# Patient Record
Sex: Female | Born: 1990 | Race: Black or African American | Hispanic: No | Marital: Single | Smoking: Current every day smoker
Health system: Southern US, Community
[De-identification: ages and names within clinical notes are randomized; demographics above are authoritative.]

## PROBLEM LIST (undated history)

## (undated) ENCOUNTER — Inpatient Hospital Stay (HOSPITAL_COMMUNITY): Payer: Self-pay

## (undated) DIAGNOSIS — K5900 Constipation, unspecified: Secondary | ICD-10-CM

## (undated) DIAGNOSIS — J45909 Unspecified asthma, uncomplicated: Secondary | ICD-10-CM

## (undated) DIAGNOSIS — J3089 Other allergic rhinitis: Secondary | ICD-10-CM

## (undated) DIAGNOSIS — K219 Gastro-esophageal reflux disease without esophagitis: Secondary | ICD-10-CM

## (undated) HISTORY — DX: Constipation, unspecified: K59.00

## (undated) HISTORY — PX: WISDOM TOOTH EXTRACTION: SHX21

---

## 2012-11-06 ENCOUNTER — Encounter (HOSPITAL_COMMUNITY): Payer: Self-pay | Admitting: Emergency Medicine

## 2012-11-06 ENCOUNTER — Emergency Department (HOSPITAL_COMMUNITY)
Admission: EM | Admit: 2012-11-06 | Discharge: 2012-11-06 | Disposition: A | Discharge status: Home / Self Care | Payer: Self-pay | Attending: Emergency Medicine | Admitting: Emergency Medicine

## 2012-11-06 DIAGNOSIS — Y9339 Activity, other involving climbing, rappelling and jumping off: Secondary | ICD-10-CM | POA: Insufficient documentation

## 2012-11-06 DIAGNOSIS — X500XXA Overexertion from strenuous movement or load, initial encounter: Secondary | ICD-10-CM | POA: Insufficient documentation

## 2012-11-06 DIAGNOSIS — F17200 Nicotine dependence, unspecified, uncomplicated: Secondary | ICD-10-CM | POA: Insufficient documentation

## 2012-11-06 DIAGNOSIS — Y9289 Other specified places as the place of occurrence of the external cause: Secondary | ICD-10-CM | POA: Insufficient documentation

## 2012-11-06 DIAGNOSIS — Y990 Civilian activity done for income or pay: Secondary | ICD-10-CM | POA: Insufficient documentation

## 2012-11-06 DIAGNOSIS — S76911A Strain of unspecified muscles, fascia and tendons at thigh level, right thigh, initial encounter: Secondary | ICD-10-CM

## 2012-11-06 DIAGNOSIS — J45909 Unspecified asthma, uncomplicated: Secondary | ICD-10-CM | POA: Insufficient documentation

## 2012-11-06 DIAGNOSIS — IMO0002 Reserved for concepts with insufficient information to code with codable children: Secondary | ICD-10-CM | POA: Insufficient documentation

## 2012-11-06 HISTORY — DX: Unspecified asthma, uncomplicated: J45.909

## 2012-11-06 MED ORDER — CYCLOBENZAPRINE HCL 10 MG PO TABS: 10.0000 mg | ORAL_TABLET | Freq: Two times a day (BID) | ORAL | Status: DC | PRN

## 2012-11-06 NOTE — ED Provider Notes (Signed)
History     CSN: [id number]  Arrival date & time 11/06/12  2118   First MD Initiated Contact with Patient 11/06/12 2143      Chief Complaint  Patient presents with  . Leg Pain    (Consider location/radiation/quality/duration/timing/severity/associated sxs/prior treatment) HPI Sabrina Perez is a 22 y.o. female who presents to the ED with right leg pain. The pain started 3 days ago. The pain is located in the right thigh. She climbs on a counter top at work and though she may have pulled a muscle. The the past 2 days the pain has increased and the thigh feels swollen. She took one Aleve yesterday. The history was provided by the patient.  Past Medical History  Diagnosis Date  . Asthma     History reviewed. No pertinent past surgical history.  History reviewed. No pertinent family history.  History  Substance Use Topics  . Smoking status: Current Every Day Smoker  . Smokeless tobacco: Not on file  . Alcohol Use: Yes     Comment: occasional    OB History   Grav Para Term Preterm Abortions TAB SAB Ect Mult Living                  Review of Systems  Constitutional: Negative for fever and chills.  HENT: Negative for neck pain.   Respiratory: Negative for cough.   Cardiovascular: Negative for chest pain.  Gastrointestinal: Negative for nausea and vomiting.  Musculoskeletal:       Right thigh pain  Skin: Negative for wound.  Psychiatric/Behavioral: The patient is not nervous/anxious.     Allergies  Motrin  Home Medications  No current outpatient prescriptions on file.  BP 145/77  Pulse 72  Temp(Src) 97.6 F (36.4 C) (Oral)  Resp 24  Ht 5\' 2"  (1.575 m)  Wt 235 lb (106.595 kg)  BMI 42.97 kg/m2  SpO2 100%  LMP 10/25/2012  Physical Exam  Nursing note and vitals reviewed. Constitutional: She is oriented to person, place, and time. She appears well-developed and well-nourished. No distress.  HENT:  Head: Normocephalic.  Eyes: EOM are normal.  Neck: Neck  supple.  Cardiovascular: Normal rate.   Pulmonary/Chest: Effort normal.  Musculoskeletal:       Right upper leg: She exhibits tenderness. She exhibits no swelling, no deformity and no laceration.       Legs: Pedal pulses strong and equal bilateral. Adequate circulation, good touch sensation. Knees without pain with palpation and passive range of motion.   Neurological: She is alert and oriented to person, place, and time. She has normal strength. No cranial nerve deficit or sensory deficit.  Skin: Skin is warm and dry.  Psychiatric: She has a normal mood and affect. Her behavior is normal.    ED Course  Procedures (including critical care time)   MDM  22 y.o. female with muscle strain of right thigh. Normal neurovascular exam. Patient stable for discharge home without any immediate complication. Will treat with flexeril and she will continue to take Aleve.  Discussed with the patient and all questioned fully answered. She will return if any problems arise.    Medication List    TAKE these medications       cyclobenzaprine 10 MG tablet  Commonly known as:  FLEXERIL  Take 1 tablet (10 mg total) by mouth 2 (two) times daily as needed for muscle spasms.               [facility] Orlene Och, NP 11/06/12  2216 

## 2012-11-06 NOTE — ED Notes (Signed)
Patient complaining of pain to right upper leg x 3 days. Reports climbs on a counter at work and thought she might have strained it, but states pain has been increasing. Also complaining of swelling to right upper leg.

## 2012-11-07 NOTE — ED Provider Notes (Signed)
Medical screening examination/treatment/procedure(s) were performed by non-physician practitioner and as supervising physician I was immediately available for consultation/collaboration.  Hurman Horn, MD 11/07/12 [phone]

## 2013-03-31 ENCOUNTER — Emergency Department (HOSPITAL_COMMUNITY)
Admission: EM | Admit: 2013-03-31 | Discharge: 2013-03-31 | Disposition: A | Discharge status: Home / Self Care | Payer: Self-pay | Attending: Emergency Medicine | Admitting: Emergency Medicine

## 2013-03-31 ENCOUNTER — Encounter (HOSPITAL_COMMUNITY): Payer: Self-pay | Admitting: Emergency Medicine

## 2013-03-31 DIAGNOSIS — Z792 Long term (current) use of antibiotics: Secondary | ICD-10-CM | POA: Insufficient documentation

## 2013-03-31 DIAGNOSIS — IMO0002 Reserved for concepts with insufficient information to code with codable children: Secondary | ICD-10-CM | POA: Insufficient documentation

## 2013-03-31 DIAGNOSIS — J209 Acute bronchitis, unspecified: Secondary | ICD-10-CM

## 2013-03-31 DIAGNOSIS — J45901 Unspecified asthma with (acute) exacerbation: Secondary | ICD-10-CM | POA: Insufficient documentation

## 2013-03-31 DIAGNOSIS — J029 Acute pharyngitis, unspecified: Secondary | ICD-10-CM | POA: Insufficient documentation

## 2013-03-31 DIAGNOSIS — F17200 Nicotine dependence, unspecified, uncomplicated: Secondary | ICD-10-CM | POA: Insufficient documentation

## 2013-03-31 MED ORDER — IPRATROPIUM BROMIDE 0.02 % IN SOLN
0.5000 mg | Freq: Once | RESPIRATORY_TRACT | Status: AC
  Administered 2013-03-31: 0.5 mg via RESPIRATORY_TRACT
  Filled 2013-03-31: qty 2.5

## 2013-03-31 MED ORDER — AZITHROMYCIN 250 MG PO TABS: 250.0000 mg | ORAL_TABLET | Freq: Every day | ORAL | Status: DC

## 2013-03-31 MED ORDER — ALBUTEROL SULFATE (5 MG/ML) 0.5% IN NEBU
5.0000 mg | INHALATION_SOLUTION | Freq: Once | RESPIRATORY_TRACT | Status: AC
  Administered 2013-03-31: 5 mg via RESPIRATORY_TRACT
  Filled 2013-03-31: qty 1

## 2013-03-31 MED ORDER — PREDNISONE 10 MG PO TABS: ORAL_TABLET | ORAL | Status: DC

## 2013-03-31 MED ORDER — PREDNISONE 50 MG PO TABS
60.0000 mg | ORAL_TABLET | Freq: Once | ORAL | Status: AC
  Administered 2013-03-31: 60 mg via ORAL
  Filled 2013-03-31 (×2): qty 1

## 2013-03-31 MED ORDER — BENZONATATE 100 MG PO CAPS: 200.0000 mg | ORAL_CAPSULE | Freq: Three times a day (TID) | ORAL | Status: DC | PRN

## 2013-03-31 MED ORDER — ALBUTEROL SULFATE HFA 108 (90 BASE) MCG/ACT IN AERS
2.0000 | INHALATION_SPRAY | Freq: Once | RESPIRATORY_TRACT | Status: AC
  Administered 2013-03-31: 2 via RESPIRATORY_TRACT
  Filled 2013-03-31: qty 6.7

## 2013-03-31 MED ORDER — BENZONATATE 100 MG PO CAPS
200.0000 mg | ORAL_CAPSULE | Freq: Once | ORAL | Status: AC
  Administered 2013-03-31: 200 mg via ORAL
  Filled 2013-03-31: qty 2

## 2013-03-31 NOTE — ED Notes (Signed)
Cough with yellow sputum. Nasal congestion.

## 2013-04-01 NOTE — ED Provider Notes (Signed)
CSN: [id number]     Arrival date & time 03/31/13  2128 History   First MD Initiated Contact with Patient 03/31/13 2149     Chief Complaint  Patient presents with  . Cough   (Consider location/radiation/quality/duration/timing/severity/associated sxs/prior Treatment) HPI Comments: Sabrina Perez is a 22 y.o. Female with a history significant for asthma (last need for albuterol about 1 year ago) presenting with nasal congestion, post nasal drip, sore throat, along with cough also productive of yellow sputum, subjective fever which kept her from sleeping last night and active wheezing.  She has taken sudafed with no significant improvement in symptoms.  She denies chest pain and shortness of breath but feels tight in her chest.  She has had no nausea, but had one episode of emesis last night with coughing. She is a current every day smoker.    The history is provided by the patient.    Past Medical History  Diagnosis Date  . Asthma    History reviewed. No pertinent past surgical history. History reviewed. No pertinent family history. History  Substance Use Topics  . Smoking status: Current Every Day Smoker  . Smokeless tobacco: Not on file  . Alcohol Use: Yes     Comment: occasional   OB History   Grav Para Term Preterm Abortions TAB SAB Ect Mult Living                 Review of Systems  Constitutional: Positive for fever and chills.  HENT: Positive for congestion, postnasal drip, rhinorrhea and sore throat. Negative for ear discharge, ear pain, facial swelling, trouble swallowing and voice change.   Eyes: Negative for discharge.  Respiratory: Positive for cough, chest tightness and wheezing. Negative for shortness of breath and stridor.   Cardiovascular: Negative for chest pain.  Gastrointestinal: Negative for abdominal pain.  Genitourinary: Negative.     Allergies  Motrin  Home Medications   Current Outpatient Rx  Name  Route  Sig  Dispense  Refill  . etonogestrel  (IMPLANON) 68 MG IMPL implant   Subcutaneous   Inject 1 each into the skin once.         . phenylephrine (SUDAFED PE) 10 MG TABS tablet   Oral   Take 10 mg by mouth once as needed (for cold symptoms).         Marland Kitchen azithromycin (ZITHROMAX) 250 MG tablet   Oral   Take 1 tablet (250 mg total) by mouth daily. Take first 2 tablets together, then 1 every day until finished.   6 tablet   0   . benzonatate (TESSALON) 100 MG capsule   Oral   Take 2 capsules (200 mg total) by mouth 3 (three) times daily as needed for cough.   30 capsule   0   . predniSONE (DELTASONE) 10 MG tablet      6, 5, 4, 3, 2 then 1 tablet by mouth daily for 6 days total.   21 tablet   0    BP 125/78  Pulse 75  Temp(Src) 98.1 F (36.7 C) (Oral)  Resp 20  Ht 5\' 2"  (1.575 m)  Wt 225 lb (102.059 kg)  BMI 41.14 kg/m2  SpO2 100%  LMP 03/28/2013 Physical Exam  Constitutional: She is oriented to person, place, and time. She appears well-developed and well-nourished.  HENT:  Head: Normocephalic and atraumatic.  Right Ear: External ear and ear canal normal. Tympanic membrane is retracted. Tympanic membrane is not injected.  Left Ear: Tympanic membrane, external  ear and ear canal normal.  Nose: Mucosal edema and rhinorrhea present. No sinus tenderness.  Mouth/Throat: Uvula is midline and mucous membranes are normal. Posterior oropharyngeal erythema present. No oropharyngeal exudate, posterior oropharyngeal edema or tonsillar abscesses.  No sinus tenderness  Eyes: Conjunctivae are normal.  Neck: Normal range of motion. Neck supple.  Cardiovascular: Normal rate and normal heart sounds.   Pulmonary/Chest: Effort normal. No respiratory distress. She has wheezes. She has no rales. She exhibits no tenderness.  Abdominal: Soft. There is no tenderness.  Musculoskeletal: Normal range of motion.  Neurological: She is alert and oriented to person, place, and time.  Skin: Skin is warm and dry. No rash noted.   Psychiatric: She has a normal mood and affect.    ED Course  Procedures (including critical care time) Labs Review Labs Reviewed - No data to display Imaging Review No results found.  EKG Interpretation   None       MDM   1. Bronchitis, acute, with bronchospasm    Glenford Peers sx which have progressed to include wheezing and bronchitic sx.  She was given an albuterol 5 and atrovent 0.5 neb tx, prednisone 60 mg po with near complete resolution of wheezing and cough.  She was also prescribed tessalon for cough.  Encouraged to consider smoking cessation.  Given smoking history, will cover for possible bacterial infection, zithromax prescribed.  The patient appears reasonably screened and/or stabilized for discharge and I doubt any other medical condition or other [facility] requiring further screening, evaluation, or treatment in the ED at this time prior to discharge.     Burgess Amor, PA-C 04/01/13 0015

## 2013-04-02 ENCOUNTER — Emergency Department (HOSPITAL_COMMUNITY)
Admission: EM | Admit: 2013-04-02 | Discharge: 2013-04-02 | Disposition: A | Discharge status: Home / Self Care | Payer: Self-pay | Attending: Emergency Medicine | Admitting: Emergency Medicine

## 2013-04-02 ENCOUNTER — Encounter (HOSPITAL_COMMUNITY): Payer: Self-pay | Admitting: Emergency Medicine

## 2013-04-02 DIAGNOSIS — Z0289 Encounter for other administrative examinations: Secondary | ICD-10-CM | POA: Insufficient documentation

## 2013-04-02 DIAGNOSIS — R05 Cough: Secondary | ICD-10-CM | POA: Insufficient documentation

## 2013-04-02 DIAGNOSIS — R059 Cough, unspecified: Secondary | ICD-10-CM | POA: Insufficient documentation

## 2013-04-02 DIAGNOSIS — Z09 Encounter for follow-up examination after completed treatment for conditions other than malignant neoplasm: Secondary | ICD-10-CM

## 2013-04-02 MED ORDER — PHENYLEPH-PROMETHAZINE-COD 5-6.25-10 MG/5ML PO SYRP: 5.0000 mL | ORAL_SOLUTION | ORAL | Status: DC | PRN

## 2013-04-02 NOTE — ED Provider Notes (Signed)
Medical screening examination/treatment/procedure(s) were performed by non-physician practitioner and as supervising physician I was immediately available for consultation/collaboration.  EKG Interpretation   None         Taevion Sikora L Adahlia Stembridge, MD 04/02/13 2342 

## 2013-04-02 NOTE — ED Provider Notes (Signed)
Medical screening examination/treatment/procedure(s) were performed by non-physician practitioner and as supervising physician I was immediately available for consultation/collaboration.  EKG Interpretation   None         Jahmia Berrett L Karthik Whittinghill, MD 04/02/13 1910 

## 2013-04-02 NOTE — ED Provider Notes (Signed)
Sabrina Perez is a 22 y.o. female who presents to the ED for a work note. She states she was here 2 nights ago and treated for bronchitis with Z-Pak, and other medications. She is feeling better but went to work today and was sent home because her cough was disturbing other employees. Told she would need a work note to come back.  BP 137/74  Pulse 98  Temp(Src) 98.1 F (36.7 C) (Oral)  Resp 24  Ht 5\' 2"  (1.575 m)  Wt 225 lb (102.059 kg)  BMI 41.14 kg/m2  SpO2 100%  LMP 03/28/2013   ROS: negative except as stated in HPI  Exam: Alert and oriented, Lungs with wheezing bilateral. Heart regular rate and rhythm.   22 y.o. female with bronchitis being treated with antibiotics, prednisone and Tessalon. Will add Phenergan VC for cough. She will return as needed.  Work given.  Discussed with the patient and all questioned fully answered. She will return if any problems arise.    Medication List    TAKE these medications       Phenyleph-Promethazine-Cod 5-6.25-10 MG/5ML Syrp  Take 5 mLs by mouth every 4 (four) hours as needed.      ASK your doctor about these medications       azithromycin 250 MG tablet  Commonly known as:  ZITHROMAX  Take 1 tablet (250 mg total) by mouth daily. Take first 2 tablets together, then 1 every day until finished.     benzonatate 100 MG capsule  Commonly known as:  TESSALON  Take 2 capsules (200 mg total) by mouth 3 (three) times daily as needed for cough.     IMPLANON 68 MG Impl implant  Generic drug:  etonogestrel  Inject 1 each into the skin once.     phenylephrine 10 MG Tabs tablet  Commonly known as:  SUDAFED PE  Take 10 mg by mouth once as needed (for cold symptoms).     predniSONE 10 MG tablet  Commonly known as:  DELTASONE  6, 5, 4, 3, 2 then 1 tablet by mouth daily for 6 days total.         Janne Napoleon, NP 04/02/13 2240

## 2013-04-02 NOTE — ED Notes (Signed)
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 Pt requesting additional work note .

## 2013-05-27 ENCOUNTER — Encounter (HOSPITAL_COMMUNITY): Payer: Self-pay | Admitting: Emergency Medicine

## 2013-05-27 ENCOUNTER — Emergency Department (INDEPENDENT_AMBULATORY_CARE_PROVIDER_SITE_OTHER)
Admission: EM | Admit: 2013-05-27 | Discharge: 2013-05-27 | Disposition: A | Discharge status: Home / Self Care | Payer: Self-pay | Source: Home / Self Care

## 2013-05-27 DIAGNOSIS — R112 Nausea with vomiting, unspecified: Secondary | ICD-10-CM

## 2013-05-27 DIAGNOSIS — R197 Diarrhea, unspecified: Secondary | ICD-10-CM

## 2013-05-27 DIAGNOSIS — J029 Acute pharyngitis, unspecified: Secondary | ICD-10-CM

## 2013-05-27 DIAGNOSIS — J040 Acute laryngitis: Secondary | ICD-10-CM

## 2013-05-27 LAB — POCT RAPID STREP A: Streptococcus, Group A Screen (Direct): NEGATIVE

## 2013-05-27 MED ORDER — CETIRIZINE-PSEUDOEPHEDRINE ER 5-120 MG PO TB12: 1.0000 | ORAL_TABLET | Freq: Every day | ORAL | Status: DC

## 2013-05-27 MED ORDER — ONDANSETRON HCL 4 MG PO TABS: 4.0000 mg | ORAL_TABLET | Freq: Four times a day (QID) | ORAL | Status: DC

## 2013-05-27 MED ORDER — AMOXICILLIN-POT CLAVULANATE 875-125 MG PO TABS: 1.0000 | ORAL_TABLET | Freq: Two times a day (BID) | ORAL | Status: DC

## 2013-05-27 MED ORDER — FLUTICASONE PROPIONATE 50 MCG/ACT NA SUSP: 2.0000 | Freq: Every day | NASAL | Status: DC

## 2013-05-27 MED ORDER — ALBUTEROL SULFATE HFA 108 (90 BASE) MCG/ACT IN AERS: 1.0000 | INHALATION_SPRAY | RESPIRATORY_TRACT | Status: DC | PRN

## 2013-05-27 NOTE — Discharge Instructions (Signed)
I suspect your illness if viral in origin.  I would hold off filling the antibiotic prescription unless you fail to improve or worsen over the next 3-5 days.  Laryngitis At the top of your windpipe is your voice box. It is the source of your voice. Inside your voice box are 2 bands of muscles called vocal cords. When you breathe, your vocal cords are relaxed and open so that air can get into the lungs. When you decide to say something, these cords come together and vibrate. The sound from these vibrations goes into your throat and comes out through your mouth as sound. Laryngitis is an inflammation of the vocal cords that causes hoarseness, cough, loss of voice, sore throat, and dry throat. Laryngitis can be temporary (acute) or long-term (chronic). Most cases of acute laryngitis improve with time.Chronic laryngitis lasts for more than 3 weeks. CAUSES Laryngitis can often be related to excessive smoking, talking, or yelling, as well as inhalation of toxic fumes and allergies. Acute laryngitis is usually caused by a viral infection, vocal strain, measles or mumps, or bacterial infections. Chronic laryngitis is usually caused by vocal cord strain, vocal cord injury, postnasal drip, growths on the vocal cords, or acid reflux. SYMPTOMS   Cough.  Sore throat.  Dry throat. RISK FACTORS  Respiratory infections.  Exposure to irritating substances, such as cigarette smoke, excessive amounts of alcohol, stomach acids, and workplace chemicals.  Voice trauma, such as vocal cord injury from shouting or speaking too loud. DIAGNOSIS  Your cargiver will perform a physical exam. During the physical exam, your caregiver will examine your throat. The most common sign of laryngitis is hoarseness. Laryngoscopy may be necessary to confirm the diagnosis of this condition. This procedure allows your caregiver to look into the larynx. HOME CARE INSTRUCTIONS  Drink enough fluids to keep your urine clear or pale  yellow.  Rest until you no longer have symptoms or as directed by your caregiver.  Breathe in moist air.  Take all medicine as directed by your caregiver.  Do not smoke.  Talk as little as possible (this includes whispering).  Write on paper instead of talking until your voice is back to normal.  Follow up with your caregiver if your condition has not improved after 10 days. SEEK MEDICAL CARE IF:   You have trouble breathing.  You cough up blood.  You have persistent fever.  You have increasing pain.  You have difficulty swallowing. MAKE SURE YOU:  Understand these instructions.  Will watch your condition.  Will get help right away if you are not doing well or get worse. Document Released: 05/11/2005 Document Revised: 08/03/2011 Document Reviewed: 07/17/2010 [facility] Patient Information 2014 [place], [place].

## 2013-05-27 NOTE — ED Notes (Signed)
C/o cold sx since Wednesday States she has runny nose, sore throat, coughing, diarrhea, and nausea/vomiting nyquil and OTC medications taking but no relief.

## 2013-05-27 NOTE — ED Provider Notes (Signed)
CSN: [id number]     Arrival date & time 05/27/13  1008 History   None    Chief Complaint  Patient presents with  . URI   (Consider location/radiation/quality/duration/timing/severity/associated sxs/prior Treatment)  HPI  Patient presents today with complaints of laryngitis and sore throat since Wednesday. States has been coughing and spitting up yellow to green bloody sputum. Reports low-grade fever on Wednesday primary complaint is sore throat and difficulty swallowing. Has had some vomiting and diarrhea.  Past Medical History  Diagnosis Date  . Asthma    History reviewed. No pertinent past surgical history. History reviewed. No pertinent family history. History  Substance Use Topics  . Smoking status: Current Every Day Smoker  . Smokeless tobacco: Not on file  . Alcohol Use: Yes     Comment: occasional   OB History   Grav Para Term Preterm Abortions TAB SAB Ect Mult Living                 Review of Systems  Constitutional: Positive for fever.       Reports of generalized malaise.  HENT: Positive for congestion, sore throat and trouble swallowing.   Eyes: Negative.   Respiratory: Positive for cough, shortness of breath and wheezing.        History of asthma has had to use her albuterol inhaler approximately twice daily with onset of illness.  Cardiovascular: Negative.   Gastrointestinal: Positive for nausea, vomiting and diarrhea. Negative for abdominal pain.  Endocrine: Negative.   Genitourinary: Negative.   Musculoskeletal: Negative.   Skin: Negative.   Allergic/Immunologic: Negative.   Neurological: Negative.   Hematological: Negative.   Psychiatric/Behavioral: Negative.     Allergies  Motrin  Home Medications   Current Outpatient Rx  Name  Route  Sig  Dispense  Refill  . albuterol (PROVENTIL HFA;VENTOLIN HFA) 108 (90 BASE) MCG/ACT inhaler   Inhalation   Inhale 1-2 puffs into the lungs every 4 (four) hours as needed for wheezing or shortness of breath.  1 Inhaler   0   . amoxicillin-clavulanate (AUGMENTIN) 875-125 MG per tablet   Oral   Take 1 tablet by mouth every 12 (twelve) hours.   14 tablet   0   . azithromycin (ZITHROMAX) 250 MG tablet   Oral   Take 1 tablet (250 mg total) by mouth daily. Take first 2 tablets together, then 1 every day until finished.   6 tablet   0   . benzonatate (TESSALON) 100 MG capsule   Oral   Take 2 capsules (200 mg total) by mouth 3 (three) times daily as needed for cough.   30 capsule   0   . cetirizine-pseudoephedrine (ZYRTEC-D) 5-120 MG per tablet   Oral   Take 1 tablet by mouth daily. At night.   15 tablet   0   . etonogestrel (IMPLANON) 68 MG IMPL implant   Subcutaneous   Inject 1 each into the skin once.         . fluticasone (FLONASE) 50 MCG/ACT nasal spray   Each Nare   Place 2 sprays into both nostrils daily.   16 g   2   . ondansetron (ZOFRAN) 4 MG tablet   Oral   Take 1 tablet (4 mg total) by mouth every 6 (six) hours.   12 tablet   0   . Phenyleph-Promethazine-Cod 5-6.25-10 MG/5ML SYRP   Oral   Take 5 mLs by mouth every 4 (four) hours as needed.   120 mL  0   . phenylephrine (SUDAFED PE) 10 MG TABS tablet   Oral   Take 10 mg by mouth once as needed (for cold symptoms).         . predniSONE (DELTASONE) 10 MG tablet      6, 5, 4, 3, 2 then 1 tablet by mouth daily for 6 days total.   21 tablet   0    BP 140/83  Pulse 64  Temp(Src) 97.9 F (36.6 C) (Oral)  Resp 16  SpO2 99%  LMP 05/23/2013  Physical Exam  Nursing note and vitals reviewed. Constitutional: She is oriented to person, place, and time. She appears well-developed and well-nourished. No distress.  HENT:  Head: Normocephalic and atraumatic.  Patient has bilateral tonsil enlargement 2+. Posterior oropharynx with moderate erythema and mucoid discharge.  No patches or exudate.  Bilateral tympanic membranes pearly gray in color with bony prominences visualized. Light reflexes are present  bilaterally.  Neck: Neck supple.  Mild anterior cervical lymphadenopathy noted  Cardiovascular: Normal rate, regular rhythm, normal heart sounds and intact distal pulses.  Exam reveals no gallop and no friction rub.   No murmur heard. Pulmonary/Chest: Effort normal and breath sounds normal. No respiratory distress. She has no wheezes. She has no rales. She exhibits no tenderness.  No adventitious breath sounds noted upon examination.  Abdominal: Soft. Bowel sounds are normal. She exhibits no distension and no mass. There is no tenderness. There is no rebound and no guarding.  Abdominal examination limited by body habitus.  Lymphadenopathy:    She has cervical adenopathy.  Neurological: She is alert and oriented to person, place, and time.  Skin: Skin is warm and dry. No rash noted. She is not diaphoretic. No erythema. No pallor.    ED Course  Procedures (including critical care time) Labs Review Labs Reviewed  CULTURE, GROUP A STREP  POCT RAPID STREP A ([facility] CARE ONLY)   Imaging Review No results found.   MDM   1. Acute pharyngitis   2. Laryngitis   3. Nausea vomiting and diarrhea    Meds ordered this encounter  Medications  . ondansetron (ZOFRAN) 4 MG tablet    Sig: Take 1 tablet (4 mg total) by mouth every 6 (six) hours.    Dispense:  12 tablet    Refill:  0  . amoxicillin-clavulanate (AUGMENTIN) 875-125 MG per tablet    Sig: Take 1 tablet by mouth every 12 (twelve) hours.    Dispense:  14 tablet    Refill:  0  . albuterol (PROVENTIL HFA;VENTOLIN HFA) 108 (90 BASE) MCG/ACT inhaler    Sig: Inhale 1-2 puffs into the lungs every 4 (four) hours as needed for wheezing or shortness of breath.    Dispense:  1 Inhaler    Refill:  0  . cetirizine-pseudoephedrine (ZYRTEC-D) 5-120 MG per tablet    Sig: Take 1 tablet by mouth daily. At night.    Dispense:  15 tablet    Refill:  0  . fluticasone (FLONASE) 50 MCG/ACT nasal spray    Sig: Place 2 sprays into both nostrils  daily.    Dispense:  16 g    Refill:  2       Jacqualyn Posey, NP 05/27/13 [phone]

## 2013-05-29 LAB — CULTURE, GROUP A STREP

## 2013-05-30 NOTE — ED Provider Notes (Signed)
Medical screening examination/treatment/procedure(s) were performed by resident physician or non-physician practitioner and as supervising physician I was immediately available for consultation/collaboration.   Pauline Good MD.   Billy Fischer, MD 05/30/13 [phone]

## 2013-05-30 NOTE — ED Notes (Signed)
Throat culture: Strep beta hemolytic not group A.  Pt. adequately treated with Augmentin.   Roselyn Meier 05/30/2013

## 2013-05-31 ENCOUNTER — Telehealth (HOSPITAL_COMMUNITY): Payer: Self-pay | Admitting: *Deleted

## 2013-05-31 NOTE — ED Notes (Addendum)
1/7  I called and left a message for pt. to call.  Call 1. 1/9 Call 2.  1/13 Call 3. 1/14 Confidential letter with result and instructions sent. Renne Musca M

## 2014-02-23 ENCOUNTER — Encounter (HOSPITAL_COMMUNITY): Payer: Self-pay | Admitting: Emergency Medicine

## 2014-02-23 ENCOUNTER — Emergency Department (HOSPITAL_COMMUNITY)
Admission: EM | Admit: 2014-02-23 | Discharge: 2014-02-23 | Disposition: A | Discharge status: Home / Self Care | Payer: Self-pay | Attending: Emergency Medicine | Admitting: Emergency Medicine

## 2014-02-23 DIAGNOSIS — Z792 Long term (current) use of antibiotics: Secondary | ICD-10-CM | POA: Insufficient documentation

## 2014-02-23 DIAGNOSIS — J45901 Unspecified asthma with (acute) exacerbation: Secondary | ICD-10-CM | POA: Insufficient documentation

## 2014-02-23 DIAGNOSIS — Z79899 Other long term (current) drug therapy: Secondary | ICD-10-CM | POA: Insufficient documentation

## 2014-02-23 DIAGNOSIS — J40 Bronchitis, not specified as acute or chronic: Secondary | ICD-10-CM

## 2014-02-23 DIAGNOSIS — Z720 Tobacco use: Secondary | ICD-10-CM | POA: Insufficient documentation

## 2014-02-23 DIAGNOSIS — Z7951 Long term (current) use of inhaled steroids: Secondary | ICD-10-CM | POA: Insufficient documentation

## 2014-02-23 DIAGNOSIS — R000 Tachycardia, unspecified: Secondary | ICD-10-CM | POA: Insufficient documentation

## 2014-02-23 DIAGNOSIS — J040 Acute laryngitis: Secondary | ICD-10-CM | POA: Insufficient documentation

## 2014-02-23 MED ORDER — BENZONATATE 200 MG PO CAPS: 200.0000 mg | ORAL_CAPSULE | Freq: Three times a day (TID) | ORAL | Status: DC | PRN

## 2014-02-23 MED ORDER — ALBUTEROL SULFATE HFA 108 (90 BASE) MCG/ACT IN AERS: 1.0000 | INHALATION_SPRAY | Freq: Four times a day (QID) | RESPIRATORY_TRACT | Status: DC | PRN

## 2014-02-23 MED ORDER — AZITHROMYCIN 250 MG PO TABS: 250.0000 mg | ORAL_TABLET | Freq: Every day | ORAL | Status: DC

## 2014-02-23 NOTE — ED Provider Notes (Signed)
CSN: [id number]     Arrival date & time 02/23/14  1518 History   First MD Initiated Contact with Patient 02/23/14 1717     Chief Complaint  Patient presents with  . Hoarse     (Consider location/radiation/quality/duration/timing/severity/associated sxs/prior Treatment) Patient is a 23 y.o. female presenting with URI. The history is provided by the patient.  URI Presenting symptoms: congestion, cough and sore throat   Severity:  Moderate Onset quality:  Gradual Duration:  1 day Timing:  Constant Progression:  Worsening Chronicity:  New Relieved by:  Nothing Worsened by:  Nothing tried Ineffective treatments:  None tried Risk factors: no recent travel    Syenna Nazir is a 23 y.o. female who presents to the ED with URI symptoms. Patient states she woke this morning with the symptoms. She smokes 1/2 ppd. She has a hx of asthma but is out of her inhaler.  Past Medical History  Diagnosis Date  . Asthma    History reviewed. No pertinent past surgical history. History reviewed. No pertinent family history. History  Substance Use Topics  . Smoking status: Current Every Day Smoker  . Smokeless tobacco: Not on file  . Alcohol Use: Yes     Comment: occasional   OB History   Grav Para Term Preterm Abortions TAB SAB Ect Mult Living                 Review of Systems  HENT: Positive for congestion, sore throat and voice change.   Respiratory: Positive for cough.   all other systems negative    Allergies  Motrin  Home Medications   Prior to Admission medications   Medication Sig Start Date End Date Taking? Authorizing Provider  albuterol (PROVENTIL HFA;VENTOLIN HFA) 108 (90 BASE) MCG/ACT inhaler Inhale 1-2 puffs into the lungs every 4 (four) hours as needed for wheezing or shortness of breath. 05/27/13   Nehemiah Settle, NP  amoxicillin-clavulanate (AUGMENTIN) 875-125 MG per tablet Take 1 tablet by mouth every 12 (twelve) hours. 05/27/13   Nehemiah Settle, NP   azithromycin (ZITHROMAX) 250 MG tablet Take 1 tablet (250 mg total) by mouth daily. Take first 2 tablets together, then 1 every day until finished. 03/31/13   Evalee Jefferson, PA-C  benzonatate (TESSALON) 100 MG capsule Take 2 capsules (200 mg total) by mouth 3 (three) times daily as needed for cough. 03/31/13   Evalee Jefferson, PA-C  cetirizine-pseudoephedrine (ZYRTEC-D) 5-120 MG per tablet Take 1 tablet by mouth daily. At night. 05/27/13   Nehemiah Settle, NP  etonogestrel (IMPLANON) 68 MG IMPL implant Inject 1 each into the skin once.    Historical Provider, MD  fluticasone (FLONASE) 50 MCG/ACT nasal spray Place 2 sprays into both nostrils daily. 05/27/13   Nehemiah Settle, NP  ondansetron (ZOFRAN) 4 MG tablet Take 1 tablet (4 mg total) by mouth every 6 (six) hours. 05/27/13   Nehemiah Settle, NP  Phenyleph-Promethazine-Cod 5-6.25-10 MG/5ML SYRP Take 5 mLs by mouth every 4 (four) hours as needed. 04/02/13   Jowell Bossi Bunnie Pion, NP  phenylephrine (SUDAFED PE) 10 MG TABS tablet Take 10 mg by mouth once as needed (for cold symptoms).    Historical Provider, MD  predniSONE (DELTASONE) 10 MG tablet 6, 5, 4, 3, 2 then 1 tablet by mouth daily for 6 days total. 03/31/13   Evalee Jefferson, PA-C   BP 133/77  Pulse 115  Temp(Src) 98.7 F (37.1 C) (Oral)  Ht 5\' 2"  (1.575 m)  Wt 230 lb (  104.327 kg)  BMI 42.06 kg/m2  SpO2 98%  LMP 02/19/2014 Physical Exam  Nursing note and vitals reviewed. Constitutional: She is oriented to person, place, and time. She appears well-developed and well-nourished.  HENT:  Head: Normocephalic.  Right Ear: Tympanic membrane normal.  Left Ear: Tympanic membrane is erythematous.  Nose: Mucosal edema present. Left sinus exhibits maxillary sinus tenderness.  Mouth/Throat: Uvula is midline and mucous membranes are normal. Posterior oropharyngeal erythema present.  Eyes: EOM are normal.  Neck: Normal range of motion. Neck supple.  Cardiovascular: Tachycardia present.   Pulmonary/Chest: Effort  normal. No respiratory distress. Wheezes: occasional. She has no rales.  Abdominal: Soft. There is no tenderness.  Musculoskeletal: Normal range of motion.  Lymphadenopathy:    She has cervical adenopathy.  Neurological: She is alert and oriented to person, place, and time. No cranial nerve deficit.  Skin: Skin is warm and dry.  Psychiatric: She has a normal mood and affect. Her behavior is normal.    ED Course  Procedures (including critical care time) Labs Review  MDM  23 y.o. female with cough, congestion, sore throat and hoarseness that started today. She is out of her albuterol inhaler and she is still smoking. Stable for discharge without fever or shortness of breath. Discussed with the patient clinical findings and plan of care and all questioned fully answered. She will return if any problems arise. I have encourage the patient to stop smoking. I will refill her Albuterol inhaler and treat her for bronchitis.     Medication List    STOP taking these medications       amoxicillin-clavulanate 875-125 MG per tablet  Commonly known as:  AUGMENTIN     predniSONE 10 MG tablet  Commonly known as:  DELTASONE      TAKE these medications       albuterol 108 (90 BASE) MCG/ACT inhaler  Commonly known as:  PROVENTIL HFA;VENTOLIN HFA  Inhale 1-2 puffs into the lungs every 6 (six) hours as needed for wheezing or shortness of breath.     azithromycin 250 MG tablet  Commonly known as:  ZITHROMAX  Take 1 tablet (250 mg total) by mouth daily. Take first 2 tablets together, then 1 every day until finished.     benzonatate 200 MG capsule  Commonly known as:  TESSALON  Take 1 capsule (200 mg total) by mouth 3 (three) times daily as needed for cough.      ASK your doctor about these medications       cetirizine-pseudoephedrine 5-120 MG per tablet  Commonly known as:  ZYRTEC-D  Take 1 tablet by mouth daily. At night.     fluticasone 50 MCG/ACT nasal spray  Commonly known as:   FLONASE  Place 2 sprays into both nostrils daily.     IMPLANON 68 MG Impl implant  Generic drug:  etonogestrel  Inject 1 each into the skin once.     ondansetron 4 MG tablet  Commonly known as:  ZOFRAN  Take 1 tablet (4 mg total) by mouth every 6 (six) hours.     Phenyleph-Promethazine-Cod 5-6.25-10 MG/5ML Syrp  Take 5 mLs by mouth every 4 (four) hours as needed.     phenylephrine 10 MG Tabs tablet  Commonly known as:  SUDAFED PE  Take 10 mg by mouth once as needed (for cold symptoms).           [place], [place] 02/24/14 [phone]

## 2014-02-23 NOTE — ED Notes (Signed)
Onset this am with hoarseness.  Sl cough,  No fever.

## 2014-02-24 NOTE — ED Provider Notes (Signed)
Medical screening examination/treatment/procedure(s) were performed by non-physician practitioner and as supervising physician I was immediately available for consultation/collaboration.   EKG Interpretation None      Rolland Porter, MD, Abram Sander   Janice Norrie, MD 02/24/14 [phone]

## 2014-10-01 ENCOUNTER — Other Ambulatory Visit (HOSPITAL_COMMUNITY): Payer: Self-pay | Admitting: Obstetrics and Gynecology

## 2014-10-01 DIAGNOSIS — O365930 Maternal care for other known or suspected poor fetal growth, third trimester, not applicable or unspecified: Secondary | ICD-10-CM

## 2014-10-02 ENCOUNTER — Other Ambulatory Visit (HOSPITAL_COMMUNITY): Payer: Self-pay | Admitting: Obstetrics and Gynecology

## 2014-10-02 ENCOUNTER — Encounter (HOSPITAL_COMMUNITY): Payer: Self-pay

## 2014-10-02 ENCOUNTER — Ambulatory Visit (HOSPITAL_COMMUNITY)
Admission: RE | Admit: 2014-10-02 | Discharge: 2014-10-02 | Disposition: A | Discharge status: Home / Self Care | Payer: Medicaid Other | Source: Ambulatory Visit | Attending: Obstetrics and Gynecology | Admitting: Obstetrics and Gynecology

## 2014-10-02 DIAGNOSIS — Z3A32 32 weeks gestation of pregnancy: Secondary | ICD-10-CM | POA: Insufficient documentation

## 2014-10-02 DIAGNOSIS — O26843 Uterine size-date discrepancy, third trimester: Secondary | ICD-10-CM | POA: Diagnosis not present

## 2014-10-02 DIAGNOSIS — O365930 Maternal care for other known or suspected poor fetal growth, third trimester, not applicable or unspecified: Secondary | ICD-10-CM

## 2014-10-02 DIAGNOSIS — Z36 Encounter for antenatal screening of mother: Secondary | ICD-10-CM | POA: Insufficient documentation

## 2014-10-02 DIAGNOSIS — O365990 Maternal care for other known or suspected poor fetal growth, unspecified trimester, not applicable or unspecified: Secondary | ICD-10-CM | POA: Insufficient documentation

## 2014-10-02 DIAGNOSIS — Z3689 Encounter for other specified antenatal screening: Secondary | ICD-10-CM | POA: Insufficient documentation

## 2014-10-03 ENCOUNTER — Other Ambulatory Visit (HOSPITAL_COMMUNITY): Payer: Self-pay | Admitting: Maternal and Fetal Medicine

## 2014-10-03 ENCOUNTER — Other Ambulatory Visit (HOSPITAL_COMMUNITY): Payer: Self-pay | Admitting: Obstetrics and Gynecology

## 2014-10-03 DIAGNOSIS — O26843 Uterine size-date discrepancy, third trimester: Secondary | ICD-10-CM

## 2014-10-09 ENCOUNTER — Ambulatory Visit (HOSPITAL_COMMUNITY): Payer: Medicaid Other

## 2014-10-10 ENCOUNTER — Ambulatory Visit (HOSPITAL_COMMUNITY)
Admission: RE | Admit: 2014-10-10 | Discharge: 2014-10-10 | Disposition: A | Discharge status: Home / Self Care | Payer: Medicaid Other | Source: Ambulatory Visit | Attending: Maternal and Fetal Medicine | Admitting: Maternal and Fetal Medicine

## 2014-10-10 ENCOUNTER — Encounter (HOSPITAL_COMMUNITY): Payer: Self-pay

## 2014-10-10 ENCOUNTER — Observation Stay (HOSPITAL_COMMUNITY)
Admission: AD | Admit: 2014-10-10 | Discharge: 2014-10-11 | Disposition: A | Discharge status: Home / Self Care | Payer: Medicaid Other | Source: Ambulatory Visit | Attending: Obstetrics & Gynecology | Admitting: Obstetrics & Gynecology

## 2014-10-10 ENCOUNTER — Encounter (HOSPITAL_COMMUNITY): Payer: Self-pay | Admitting: General Practice

## 2014-10-10 DIAGNOSIS — F17210 Nicotine dependence, cigarettes, uncomplicated: Secondary | ICD-10-CM | POA: Diagnosis not present

## 2014-10-10 DIAGNOSIS — O99333 Smoking (tobacco) complicating pregnancy, third trimester: Secondary | ICD-10-CM | POA: Insufficient documentation

## 2014-10-10 DIAGNOSIS — Z3A33 33 weeks gestation of pregnancy: Secondary | ICD-10-CM

## 2014-10-10 DIAGNOSIS — O4590 Premature separation of placenta, unspecified, unspecified trimester: Secondary | ICD-10-CM | POA: Diagnosis present

## 2014-10-10 DIAGNOSIS — O365930 Maternal care for other known or suspected poor fetal growth, third trimester, not applicable or unspecified: Secondary | ICD-10-CM | POA: Diagnosis present

## 2014-10-10 DIAGNOSIS — Z3A34 34 weeks gestation of pregnancy: Secondary | ICD-10-CM | POA: Diagnosis not present

## 2014-10-10 DIAGNOSIS — J45909 Unspecified asthma, uncomplicated: Secondary | ICD-10-CM | POA: Insufficient documentation

## 2014-10-10 DIAGNOSIS — O99513 Diseases of the respiratory system complicating pregnancy, third trimester: Secondary | ICD-10-CM | POA: Insufficient documentation

## 2014-10-10 DIAGNOSIS — O26843 Uterine size-date discrepancy, third trimester: Secondary | ICD-10-CM

## 2014-10-10 DIAGNOSIS — IMO0002 Reserved for concepts with insufficient information to code with codable children: Secondary | ICD-10-CM | POA: Diagnosis present

## 2014-10-10 DIAGNOSIS — O365990 Maternal care for other known or suspected poor fetal growth, unspecified trimester, not applicable or unspecified: Secondary | ICD-10-CM | POA: Diagnosis present

## 2014-10-10 HISTORY — DX: Gastro-esophageal reflux disease without esophagitis: K21.9

## 2014-10-10 LAB — COMPREHENSIVE METABOLIC PANEL
ALBUMIN: 3.4 g/dL — AB (ref 3.5–5.0)
ALT: 6 U/L — ABNORMAL LOW (ref 14–54)
ANION GAP: 9 (ref 5–15)
AST: 15 U/L (ref 15–41)
Alkaline Phosphatase: 84 U/L (ref 38–126)
BILIRUBIN TOTAL: 0.3 mg/dL (ref 0.3–1.2)
BUN: 5 mg/dL — AB (ref 6–20)
CALCIUM: 8.7 mg/dL — AB (ref 8.9–10.3)
CHLORIDE: 105 mmol/L (ref 101–111)
CO2: 21 mmol/L — ABNORMAL LOW (ref 22–32)
CREATININE: 0.52 mg/dL (ref 0.44–1.00)
GFR calc Af Amer: >60 mL/min (ref >60)
GFR calc non Af Amer: >60 mL/min (ref >60)
Glucose, Bld: 90 mg/dL (ref 65–99)
Potassium: 3.8 mmol/L (ref 3.5–5.1)
Sodium: 135 mmol/L (ref 135–145)
Total Protein: 6.6 g/dL (ref 6.5–8.1)

## 2014-10-10 LAB — PROTEIN / CREATININE RATIO, URINE: CREATININE, URINE: 58 mg/dL

## 2014-10-10 LAB — URINALYSIS, ROUTINE W REFLEX MICROSCOPIC
Bilirubin Urine: NEGATIVE
Glucose, UA: NEGATIVE mg/dL
HGB URINE DIPSTICK: NEGATIVE
KETONES UR: NEGATIVE mg/dL
Leukocytes, UA: NEGATIVE
Nitrite: NEGATIVE
PROTEIN: NEGATIVE mg/dL
Specific Gravity, Urine: 1.015 (ref 1.005–1.030)
Urobilinogen, UA: 0.2 mg/dL (ref 0.0–1.0)
pH: 7 (ref 5.0–8.0)

## 2014-10-10 LAB — FIBRINOGEN: FIBRINOGEN: 444 mg/dL (ref 204–475)

## 2014-10-10 MED ORDER — ACETAMINOPHEN 325 MG PO TABS: 650.0000 mg | ORAL_TABLET | ORAL | Status: DC | PRN

## 2014-10-10 MED ORDER — ZOLPIDEM TARTRATE 5 MG PO TABS: 5.0000 mg | ORAL_TABLET | Freq: Every evening | ORAL | Status: DC | PRN

## 2014-10-10 MED ORDER — CALCIUM CARBONATE ANTACID 500 MG PO CHEW
2.0000 | CHEWABLE_TABLET | ORAL | Status: DC | PRN
  Administered 2014-10-10: 400 mg via ORAL
  Filled 2014-10-10: qty 2

## 2014-10-10 MED ORDER — PRENATAL MULTIVITAMIN CH
1.0000 | ORAL_TABLET | Freq: Every day | ORAL | Status: DC
  Administered 2014-10-11: 1 via ORAL
  Filled 2014-10-10: qty 1

## 2014-10-10 MED ORDER — DOCUSATE SODIUM 100 MG PO CAPS
100.0000 mg | ORAL_CAPSULE | Freq: Every day | ORAL | Status: DC
  Administered 2014-10-11: 100 mg via ORAL
  Filled 2014-10-10: qty 1

## 2014-10-10 NOTE — H&P (Signed)
Sabrina Perez is a 24 y.o. female presenting for further assessment for IUGR, abnormal US findings. G1P0 [redacted]w[redacted]d Prenatal care in [place], [place], followed by [place] here with IUGR. Dr Lisbeth Renshaw requested observation due to possible placental abnormality on [place] today, r/o abruptio.Marland Kitchen History OB History    Gravida Para Term Preterm AB TAB SAB Ectopic Multiple Living   1              Past Medical History  Diagnosis Date  . Asthma    No past surgical history on file. Family History: family history is not on file. Social History:  reports that she has been smoking.  She does not have any smokeless tobacco history on file. She reports that she drinks alcohol. She reports that she does not use illicit drugs.   Prenatal Transfer Tool  Maternal Diabetes: No Genetic Screening: Normal Maternal Ultrasounds/Referrals: Abnormal:  Findings:   IUGR Fetal Ultrasounds or other Referrals:  None Maternal Substance Abuse:  No Significant Maternal Medications:  None Significant Maternal Lab Results:  None Other Comments:  None  Review of Systems  Constitutional: Negative.   Cardiovascular: Negative.   Gastrointestinal: Negative.   Genitourinary: Negative.       Blood pressure 109/63, pulse 70, temperature 98.3 F (36.8 C), temperature source Oral, resp. rate 18, height 5\' 3"  (1.6 m), weight 97.07 kg (214 lb), last menstrual period 02/16/2014. Maternal Exam:  Abdomen: Patient reports no abdominal tenderness. Introitus: not evaluated.   Cervix: not evaluated.   Physical Exam  Vitals reviewed. Constitutional: She appears well-developed. No distress.  HENT:  Head: Normocephalic and atraumatic.  Cardiovascular: Normal rate.   Respiratory: Effort normal. No respiratory distress.  GI: Soft. There is no tenderness.  Musculoskeletal: Normal range of motion.  Skin: Skin is warm and dry.  Psychiatric: She has a normal mood and affect. Her behavior is normal.    Prenatal labs: ABO, Rh:   Antibody:    Rubella:   RPR:    HBsAg:    HIV:    GBS:     Assessment/Plan: [redacted]w[redacted]d IUGR, high normal cord doppler and subchorionic fluid noted r/o abruptio placentae. PIH labs, fibrinogen ordered and continuous monitoring to repeat US tomorrow in [place].Recently received betamethasone IM   [name] 10/10/2014, 4:49 PM

## 2014-10-11 ENCOUNTER — Observation Stay (HOSPITAL_COMMUNITY): Payer: Medicaid Other

## 2014-10-11 ENCOUNTER — Encounter (HOSPITAL_COMMUNITY): Payer: Self-pay | Admitting: *Deleted

## 2014-10-11 DIAGNOSIS — Z3A33 33 weeks gestation of pregnancy: Secondary | ICD-10-CM

## 2014-10-11 DIAGNOSIS — O4590 Premature separation of placenta, unspecified, unspecified trimester: Secondary | ICD-10-CM | POA: Diagnosis present

## 2014-10-11 DIAGNOSIS — O365931 Maternal care for other known or suspected poor fetal growth, third trimester, fetus 1: Secondary | ICD-10-CM

## 2014-10-11 DIAGNOSIS — O365930 Maternal care for other known or suspected poor fetal growth, third trimester, not applicable or unspecified: Secondary | ICD-10-CM | POA: Diagnosis not present

## 2014-10-11 LAB — CBC
HCT: 34.9 % — ABNORMAL LOW (ref 36.0–46.0)
Hemoglobin: 12.4 g/dL (ref 12.0–15.0)
MCH: 33.8 pg (ref 26.0–34.0)
MCHC: 35.5 g/dL (ref 30.0–36.0)
MCV: 95.1 fL (ref 78.0–100.0)
Platelets: 194 10*3/uL (ref 150–400)
RBC: 3.67 MIL/uL — ABNORMAL LOW (ref 3.87–5.11)
RDW: 12.9 % (ref 11.5–15.5)
WBC: 8.1 10*3/uL (ref 4.0–10.5)

## 2014-10-11 LAB — TYPE AND SCREEN
ABO/RH(D): B POS
Antibody Screen: NEGATIVE

## 2014-10-11 LAB — ABO/RH: ABO/RH(D): B POS

## 2014-10-11 NOTE — Plan of Care (Signed)
Problem: Consults Goal: Birthing Suites Patient Information Press F2 to bring up selections list   Pt < [redacted] weeks EGA     

## 2014-10-11 NOTE — Progress Notes (Signed)
Pt dressed and ready to go home.

## 2014-10-11 NOTE — Progress Notes (Signed)
Pt given discharge instructions and states that she has a f/u appt with her OB in [place] in the morning. Pt instructed to keep appt and PTL instructions and fetal movement info. Pt states understanding.

## 2014-10-11 NOTE — Discharge Summary (Signed)
Antenatal Physician Discharge Summary  Patient ID: Sabrina Perez MRN: [id number] DOB/AGE: 1990/07/09 24 y.o.  Admit date: 10/10/2014 Discharge date: 10/11/2014  Admission Diagnoses: Intrauterine uterine pregnancy at [redacted]w[redacted]d, intrauterine growth retardation (IUGR), suspected placental abruption on ultrasound  Discharge Diagnoses:  Intrauterine uterine pregnancy at [redacted]w[redacted]d, intrauterine growth retardation (IUGR), suspected placental abruption on ultrasound  Prenatal Procedures: NST and ultrasound  Consults: Maternal Fetal Medicine  Hospital Course:  This is a 24 y.o. G1P0 with IUP at [redacted]w[redacted]d admitted for observation in the setting of known IUGR and suspected 2 cm placental abruption.  Patient gets her prenatal care at [facility] but undergoes her ultrasounds here at [facility] with MFM.  On 10/10/14, her ultrasound showed 2 cm placental abruption and BPP 8/8.  MFM recommended prolonged monitoring; FHT remained reassuring and there was no bleeding or contractions.  No leaking of fluid, had good fetal movement.  Repeat ultrasound on 10/11/2014 showed stable abruption, repeat BPP 10/10.  Of note, she already completed a course of betamethasone last week.  She had no signs/symptoms of progressing preterm labor or other maternal-fetal concerns. She was deemed stable for discharge to home with outpatient follow up; has weekly visits scheduled with MFM and weekly NSTs with her primary OB.  Significant Diagnostic Studies:  Results for orders placed or performed during the hospital encounter of 10/10/14 (from the past 168 hour(s))  Urinalysis, Routine w reflex microscopic   Collection Time: 10/10/14  5:15 PM  Result Value Ref Range   Color, Urine YELLOW YELLOW   APPearance CLEAR CLEAR   Specific Gravity, Urine 1.015 1.005 - 1.030   pH 7.0 5.0 - 8.0   Glucose, UA NEGATIVE NEGATIVE mg/dL   Hgb urine dipstick NEGATIVE NEGATIVE   Bilirubin Urine NEGATIVE NEGATIVE   Ketones, ur NEGATIVE NEGATIVE mg/dL   Protein, ur  NEGATIVE NEGATIVE mg/dL   Urobilinogen, UA 0.2 0.0 - 1.0 mg/dL   Nitrite NEGATIVE NEGATIVE   Leukocytes, UA NEGATIVE NEGATIVE  Protein / creatinine ratio, urine   Collection Time: 10/10/14  5:15 PM  Result Value Ref Range   Creatinine, Urine 58.00 mg/dL   Total Protein, Urine <6 mg/dL   Protein Creatinine Ratio        0.00 - 0.15 mg/mg[Cre]  Comprehensive metabolic panel   Collection Time: 10/10/14  5:25 PM  Result Value Ref Range   Sodium 135 135 - 145 mmol/L   Potassium 3.8 3.5 - 5.1 mmol/L   Chloride 105 101 - 111 mmol/L   CO2 21 (L) 22 - 32 mmol/L   Glucose, Bld 90 65 - 99 mg/dL   BUN 5 (L) 6 - 20 mg/dL   Creatinine, Ser 0.52 0.44 - 1.00 mg/dL   Calcium 8.7 (L) 8.9 - 10.3 mg/dL   Total Protein 6.6 6.5 - 8.1 g/dL   Albumin 3.4 (L) 3.5 - 5.0 g/dL   AST 15 15 - 41 U/L   ALT 6 (L) 14 - 54 U/L   Alkaline Phosphatase 84 38 - 126 U/L   Total Bilirubin 0.3 0.3 - 1.2 mg/dL   GFR calc non Af Amer >60 >60 mL/min   GFR calc Af Amer >60 >60 mL/min   Anion gap 9 5 - 15  Fibrinogen   Collection Time: 10/10/14  5:25 PM  Result Value Ref Range   Fibrinogen 444 204 - 475 mg/dL  CBC   Collection Time: 10/11/14  5:47 AM  Result Value Ref Range   WBC 8.1 4.0 - 10.5 K/uL   RBC 3.67 (L)  3.87 - 5.11 MIL/uL   Hemoglobin 12.4 12.0 - 15.0 g/dL   HCT 34.9 (L) 36.0 - 46.0 %   MCV 95.1 78.0 - 100.0 fL   MCH 33.8 26.0 - 34.0 pg   MCHC 35.5 30.0 - 36.0 g/dL   RDW 12.9 11.5 - 15.5 %   Platelets 194 150 - 400 K/uL  Type and screen   Collection Time: 10/11/14  7:50 AM  Result Value Ref Range   ABO/RH(D) B POS    Antibody Screen NEG    Sample Expiration 10/14/2014   ABO/Rh   Collection Time: 10/11/14  7:50 AM  Result Value Ref Range   ABO/RH(D) B POS          Discharge Exam: BP 126/72 mmHg  Pulse 71  Temp(Src) 98.8 F (37.1 C) (Oral)  Resp 20  Ht 5\' 3"  (1.6 m)  Wt 214 lb (97.07 kg)  BMI 37.92 kg/m2  LMP 02/16/2014 General appearance: alert and no distress Head:  Normocephalic, without obvious abnormality, atraumatic Cardio: regular rate and rhythm GI: soft, non-tender; bowel sounds normal; no masses,  no organomegaly Pelvic: deferred Extremities: extremities normal, atraumatic, no cyanosis or edema and Homans sign is negative, no sign of DVT  Discharge Condition: Stable  Disposition: 01-Home or Self Care     Medication List    STOP taking these medications        azithromycin 250 MG tablet  Commonly known as:  ZITHROMAX     benzonatate 200 MG capsule  Commonly known as:  TESSALON      TAKE these medications        albuterol 108 (90 BASE) MCG/ACT inhaler  Commonly known as:  PROVENTIL HFA;VENTOLIN HFA  Inhale 1-2 puffs into the lungs every 6 (six) hours as needed for wheezing or shortness of breath.     beclomethasone 40 MCG/ACT inhaler  Commonly known as:  QVAR  Inhale 1 puff into the lungs 2 (two) times daily.     calcium carbonate 500 MG chewable tablet  Commonly known as:  TUMS - dosed in mg elemental calcium  Chew 1 tablet by mouth 4 (four) times daily as needed for indigestion or heartburn.     cetirizine-pseudoephedrine 5-120 MG per tablet  Commonly known as:  ZYRTEC-D  Take 1 tablet by mouth daily. At night.     fluticasone 50 MCG/ACT nasal spray  Commonly known as:  FLONASE  Place 2 sprays into both nostrils daily.     ondansetron 4 MG tablet  Commonly known as:  ZOFRAN  Take 1 tablet (4 mg total) by mouth every 6 (six) hours.     Phenyleph-Promethazine-Cod 5-6.25-10 MG/5ML Syrp  Take 5 mLs by mouth every 4 (four) hours as needed.     STOOL SOFTENER PO  Take 2 tablets by mouth every morning.         SignedOsborne Oman M.D. 10/11/2014, 4:49 PM

## 2014-10-16 ENCOUNTER — Ambulatory Visit (HOSPITAL_COMMUNITY): Payer: Medicaid Other

## 2014-10-17 ENCOUNTER — Ambulatory Visit (HOSPITAL_COMMUNITY)
Admission: RE | Admit: 2014-10-17 | Discharge: 2014-10-17 | Disposition: A | Discharge status: Home / Self Care | Payer: Medicaid Other | Source: Ambulatory Visit | Attending: Maternal and Fetal Medicine | Admitting: Maternal and Fetal Medicine

## 2014-10-17 ENCOUNTER — Other Ambulatory Visit (HOSPITAL_COMMUNITY): Payer: Self-pay | Admitting: Maternal and Fetal Medicine

## 2014-10-17 DIAGNOSIS — Z3A34 34 weeks gestation of pregnancy: Secondary | ICD-10-CM | POA: Diagnosis not present

## 2014-10-17 DIAGNOSIS — O26843 Uterine size-date discrepancy, third trimester: Secondary | ICD-10-CM

## 2014-10-17 DIAGNOSIS — O99213 Obesity complicating pregnancy, third trimester: Secondary | ICD-10-CM | POA: Diagnosis not present

## 2014-10-17 DIAGNOSIS — O365930 Maternal care for other known or suspected poor fetal growth, third trimester, not applicable or unspecified: Secondary | ICD-10-CM | POA: Diagnosis present

## 2014-10-23 ENCOUNTER — Ambulatory Visit (HOSPITAL_COMMUNITY): Payer: Medicaid Other

## 2014-10-24 ENCOUNTER — Ambulatory Visit (HOSPITAL_COMMUNITY)
Admission: RE | Admit: 2014-10-24 | Discharge: 2014-10-24 | Disposition: A | Discharge status: Home / Self Care | Payer: Medicaid Other | Source: Ambulatory Visit | Attending: Maternal and Fetal Medicine | Admitting: Maternal and Fetal Medicine

## 2014-10-24 DIAGNOSIS — O99513 Diseases of the respiratory system complicating pregnancy, third trimester: Secondary | ICD-10-CM | POA: Diagnosis not present

## 2014-10-24 DIAGNOSIS — Z3A35 35 weeks gestation of pregnancy: Secondary | ICD-10-CM | POA: Insufficient documentation

## 2014-10-24 DIAGNOSIS — O365930 Maternal care for other known or suspected poor fetal growth, third trimester, not applicable or unspecified: Secondary | ICD-10-CM | POA: Insufficient documentation

## 2014-10-24 DIAGNOSIS — J45909 Unspecified asthma, uncomplicated: Secondary | ICD-10-CM | POA: Insufficient documentation

## 2014-10-24 DIAGNOSIS — O26843 Uterine size-date discrepancy, third trimester: Secondary | ICD-10-CM

## 2014-10-29 ENCOUNTER — Encounter (HOSPITAL_COMMUNITY): Payer: Self-pay

## 2014-10-29 ENCOUNTER — Ambulatory Visit (HOSPITAL_COMMUNITY)
Admission: RE | Admit: 2014-10-29 | Discharge: 2014-10-29 | Disposition: A | Discharge status: Home / Self Care | Payer: Medicaid Other | Source: Ambulatory Visit | Attending: Obstetrics and Gynecology | Admitting: Obstetrics and Gynecology

## 2014-10-29 DIAGNOSIS — O26843 Uterine size-date discrepancy, third trimester: Secondary | ICD-10-CM | POA: Diagnosis not present

## 2014-10-29 DIAGNOSIS — O365990 Maternal care for other known or suspected poor fetal growth, unspecified trimester, not applicable or unspecified: Secondary | ICD-10-CM | POA: Insufficient documentation

## 2014-10-29 DIAGNOSIS — Z3A36 36 weeks gestation of pregnancy: Secondary | ICD-10-CM | POA: Insufficient documentation

## 2014-11-05 ENCOUNTER — Ambulatory Visit (HOSPITAL_COMMUNITY): Payer: Medicaid Other

## 2014-11-12 ENCOUNTER — Ambulatory Visit (HOSPITAL_COMMUNITY): Payer: Medicaid Other

## 2015-08-07 ENCOUNTER — Encounter (HOSPITAL_COMMUNITY): Payer: Self-pay | Admitting: *Deleted

## 2015-11-14 ENCOUNTER — Encounter (HOSPITAL_COMMUNITY): Payer: Self-pay | Admitting: Emergency Medicine

## 2015-11-14 ENCOUNTER — Emergency Department (HOSPITAL_COMMUNITY)
Admission: EM | Admit: 2015-11-14 | Discharge: 2015-11-14 | Disposition: A | Discharge status: Home / Self Care | Payer: Medicaid Other | Attending: Emergency Medicine | Admitting: Emergency Medicine

## 2015-11-14 DIAGNOSIS — Y939 Activity, unspecified: Secondary | ICD-10-CM | POA: Diagnosis not present

## 2015-11-14 DIAGNOSIS — S20219A Contusion of unspecified front wall of thorax, initial encounter: Secondary | ICD-10-CM | POA: Insufficient documentation

## 2015-11-14 DIAGNOSIS — Y929 Unspecified place or not applicable: Secondary | ICD-10-CM | POA: Diagnosis not present

## 2015-11-14 DIAGNOSIS — F17210 Nicotine dependence, cigarettes, uncomplicated: Secondary | ICD-10-CM | POA: Diagnosis not present

## 2015-11-14 DIAGNOSIS — M542 Cervicalgia: Secondary | ICD-10-CM | POA: Insufficient documentation

## 2015-11-14 DIAGNOSIS — J45909 Unspecified asthma, uncomplicated: Secondary | ICD-10-CM | POA: Insufficient documentation

## 2015-11-14 DIAGNOSIS — Y999 Unspecified external cause status: Secondary | ICD-10-CM | POA: Diagnosis not present

## 2015-11-14 DIAGNOSIS — M62838 Other muscle spasm: Secondary | ICD-10-CM | POA: Diagnosis not present

## 2015-11-14 DIAGNOSIS — R0781 Pleurodynia: Secondary | ICD-10-CM

## 2015-11-14 DIAGNOSIS — W010XXA Fall on same level from slipping, tripping and stumbling without subsequent striking against object, initial encounter: Secondary | ICD-10-CM | POA: Insufficient documentation

## 2015-11-14 DIAGNOSIS — R062 Wheezing: Secondary | ICD-10-CM

## 2015-11-14 DIAGNOSIS — S299XXA Unspecified injury of thorax, initial encounter: Secondary | ICD-10-CM | POA: Diagnosis present

## 2015-11-14 DIAGNOSIS — Z720 Tobacco use: Secondary | ICD-10-CM

## 2015-11-14 MED ORDER — CYCLOBENZAPRINE HCL 5 MG PO TABS: 5.0000 mg | ORAL_TABLET | Freq: Three times a day (TID) | ORAL | Status: DC | PRN

## 2015-11-14 MED ORDER — TRAMADOL HCL 50 MG PO TABS: 50.0000 mg | ORAL_TABLET | Freq: Four times a day (QID) | ORAL | Status: DC | PRN

## 2015-11-14 MED ORDER — ALBUTEROL SULFATE HFA 108 (90 BASE) MCG/ACT IN AERS
2.0000 | INHALATION_SPRAY | Freq: Once | RESPIRATORY_TRACT | Status: AC
  Administered 2015-11-14: 2 via RESPIRATORY_TRACT
  Filled 2015-11-14: qty 6.7

## 2015-11-14 MED ORDER — NAPROXEN 375 MG PO TABS: 375.0000 mg | ORAL_TABLET | Freq: Two times a day (BID) | ORAL | Status: DC

## 2015-11-14 NOTE — ED Notes (Signed)
Pt fell while in rain on Friday, c/o right rib pain, also right neck pain.  Bruised on left side per pt

## 2015-11-14 NOTE — ED Notes (Signed)
Pt was getting out of her vehicle when she slipped and fell. Pt states she was seen at [facility] they did xrays of her ribs but did not find anything.

## 2015-11-14 NOTE — ED Notes (Signed)
Pt states that she was seen at [facility] 2 days ago and dx with contusion

## 2015-11-14 NOTE — Discharge Instructions (Signed)
YOU MAY USE YOUR INHALER 1-2 PUFFS EVERY 4 HOURS AS NEEDED FOR WHEEZING OR COUGH. PLEASE CONTINUE TO DO DEEP BREATHING 4-5 TIMES A DAY EVEN IF IT HURTS.   Rib Contusion A rib contusion is a deep bruise on your rib area. Contusions are the result of a blunt trauma that causes bleeding and injury to the tissues under the skin. A rib contusion may involve bruising of the ribs and of the skin and muscles in the area. The skin overlying the contusion may turn blue, purple, or yellow. Minor injuries will give you a painless contusion, but more severe contusions may stay painful and swollen for a few weeks. CAUSES  A contusion is usually caused by a blow, trauma, or direct force to an area of the body. This often occurs while playing contact sports. SYMPTOMS  Swelling and redness of the injured area.  Discoloration of the injured area.  Tenderness and soreness of the injured area.  Pain with or without movement. DIAGNOSIS  The diagnosis can be made by taking a medical history and performing a physical exam. An X-ray, CT scan, or MRI may be needed to determine if there were any associated injuries, such as broken bones (fractures) or internal injuries. TREATMENT  Often, the best treatment for a rib contusion is rest. Icing or applying cold compresses to the injured area may help reduce swelling and inflammation. Deep breathing exercises may be recommended to reduce the risk of partial lung collapse and pneumonia. Over-the-counter or prescription medicines may also be recommended for pain control. HOME CARE INSTRUCTIONS   Apply ice to the injured area:  Put ice in a plastic bag.  Place a towel between your skin and the bag.  Leave the ice on for 20 minutes, 2-3 times per day.  Take medicines only as directed by your health care provider.  Rest the injured area. Avoid strenuous activity and any activities or movements that cause pain. Be careful during activities and avoid bumping the injured  area.  Perform deep-breathing exercises as directed by your health care provider.  Do not lift anything that is heavier than 5 lb (2.3 kg) until your health care provider approves.  Do not use any tobacco products, including cigarettes, chewing tobacco, or electronic cigarettes. If you need help quitting, ask your health care provider. SEEK MEDICAL CARE IF:   You have increased bruising or swelling.  You have pain that is not controlled with treatment.  You have a fever. SEEK IMMEDIATE MEDICAL CARE IF:   You have difficulty breathing or shortness of breath.  You develop a continual cough, or you cough up thick or bloody sputum.  You feel sick to your stomach (nauseous), you throw up (vomit), or you have abdominal pain.   This information is not intended to replace advice given to you by your health care provider. Make sure you discuss any questions you have with your health care provider.   Document Released: 02/03/2001 Document Revised: 06/01/2014 Document Reviewed: 02/20/2014 Elsevier Interactive Patient Education 2016 [employer].  Smoking Cessation, Tips for Success If you are ready to quit smoking, congratulations! You have chosen to help yourself be healthier. Cigarettes bring nicotine, tar, carbon monoxide, and other irritants into your body. Your lungs, heart, and blood vessels will be able to work better without these poisons. There are many different ways to quit smoking. Nicotine gum, nicotine patches, a nicotine inhaler, or nicotine nasal spray can help with physical craving. Hypnosis, support groups, and medicines help break  the habit of smoking. WHAT THINGS CAN I DO TO MAKE QUITTING EASIER?  Here are some tips to help you quit for good:  Pick a date when you will quit smoking completely. Tell all of your friends and family about your plan to quit on that date.  Do not try to slowly cut down on the number of cigarettes you are smoking. Pick a quit date and quit  smoking completely starting on that day.  Throw away all cigarettes.   Clean and remove all ashtrays from your home, work, and car.  On a card, write down your reasons for quitting. Carry the card with you and read it when you get the urge to smoke.  Cleanse your body of nicotine. Drink enough water and fluids to keep your urine clear or pale yellow. Do this after quitting to flush the nicotine from your body.  Learn to predict your moods. Do not let a bad situation be your excuse to have a cigarette. Some situations in your life might tempt you into wanting a cigarette.  Never have "just one" cigarette. It leads to wanting another and another. Remind yourself of your decision to quit.  Change habits associated with smoking. If you smoked while driving or when feeling stressed, try other activities to replace smoking. Stand up when drinking your coffee. Brush your teeth after eating. Sit in a different chair when you read the paper. Avoid alcohol while trying to quit, and try to drink fewer caffeinated beverages. Alcohol and caffeine may urge you to smoke.  Avoid foods and drinks that can trigger a desire to smoke, such as sugary or spicy foods and alcohol.  Ask people who smoke not to smoke around you.  Have something planned to do right after eating or having a cup of coffee. For example, plan to take a walk or exercise.  Try a relaxation exercise to calm you down and decrease your stress. Remember, you may be tense and nervous for the first 2 weeks after you quit, but this will pass.  Find new activities to keep your hands busy. Play with a pen, coin, or rubber band. Doodle or draw things on paper.  Brush your teeth right after eating. This will help cut down on the craving for the taste of tobacco after meals. You can also try mouthwash.   Use oral substitutes in place of cigarettes. Try using lemon drops, carrots, cinnamon sticks, or chewing gum. Keep them handy so they are  available when you have the urge to smoke.  When you have the urge to smoke, try deep breathing.  Designate your home as a nonsmoking area.  If you are a heavy smoker, ask your health care provider about a prescription for nicotine chewing gum. It can ease your withdrawal from nicotine.  Reward yourself. Set aside the cigarette money you save and buy yourself something nice.  Look for support from others. Join a support group or smoking cessation program. Ask someone at home or at work to help you with your plan to quit smoking.  Always ask yourself, "Do I need this cigarette or is this just a reflex?" Tell yourself, "Today, I choose not to smoke," or "I do not want to smoke." You are reminding yourself of your decision to quit.  Do not replace cigarette smoking with electronic cigarettes (commonly called e-cigarettes). The safety of e-cigarettes is unknown, and some may contain harmful chemicals.  If you relapse, do not give up! Plan ahead and think  about what you will do the next time you get the urge to smoke. HOW WILL I FEEL WHEN I QUIT SMOKING? You may have symptoms of withdrawal because your body is used to nicotine (the addictive substance in cigarettes). You may crave cigarettes, be irritable, feel very hungry, cough often, get headaches, or have difficulty concentrating. The withdrawal symptoms are only temporary. They are strongest when you first quit but will go away within 10-14 days. When withdrawal symptoms occur, stay in control. Think about your reasons for quitting. Remind yourself that these are signs that your body is healing and getting used to being without cigarettes. Remember that withdrawal symptoms are easier to treat than the major diseases that smoking can cause.  Even after the withdrawal is over, expect periodic urges to smoke. However, these cravings are generally short lived and will go away whether you smoke or not. Do not smoke! WHAT RESOURCES ARE AVAILABLE TO  HELP ME QUIT SMOKING? Your health care provider can direct you to community resources or hospitals for support, which may include:  Group support.  Education.  Hypnosis.  Therapy.   This information is not intended to replace advice given to you by your health care provider. Make sure you discuss any questions you have with your health care provider.   Document Released: 02/07/2004 Document Revised: 06/01/2014 Document Reviewed: 10/27/2012 Elsevier Interactive Patient Education [employer].

## 2015-11-14 NOTE — ED Provider Notes (Signed)
CSN: [id number]     Arrival date & time 11/14/15  1547 History   First MD Initiated Contact with Patient 11/14/15 1612     Chief Complaint  Patient presents with  . Rib Injury     (Consider location/radiation/quality/duration/timing/severity/associated sxs/prior Treatment) HPI  Sabrina Perez This 25 year old female who presents emergency Department with chief complaint of right rib cage pain and right trapezius muscle pain.. She is a past medical history of asthma, reflux and is a chronic daily smoker. Patient states that last Friday 1 week ago. The patient lost her footing getting out of her truck and slipped with her feet coming out from under her, landing onto her right rib cage. She did not hit her head or lose consciousness. She was seen at [facility] and had an x-ray which was negative and diagnosed with a right rib contusion. Was taking Norco for pain and still has one and a half tablets left. She states her pain has actually worsened over the past week and she was told to return for worsening pain. She is also complaining of tightness in her right shoulder region, which is worse with flexion of the right shoulder. She denies any pain in the right shoulder joint. She denies numbness or tingling. She denies headaches or other neurologic symptoms. She denies hemoptysis, dyspnea on exertion, history of DVT or calf swelling. Patient has some wheezing and is out of her albuterol inhaler.  Past Medical History  Diagnosis Date  . Asthma   . GERD (gastroesophageal reflux disease)    Past Surgical History  Procedure Laterality Date  . Wisdom tooth extraction     No family history on file. Social History  Substance Use Topics  . Smoking status: Current Every Day Smoker -- 0.50 packs/day for 4 years    Types: Cigarettes  . Smokeless tobacco: None  . Alcohol Use: Yes     Comment: occasional, prior to pregnancy   OB History    Gravida Para Term Preterm AB TAB SAB Ectopic Multiple  Living   1              Review of Systems  Constitutional: Negative for fever and chills.  Respiratory: Positive for wheezing. Negative for cough and shortness of breath.   Cardiovascular: Negative for palpitations.  Musculoskeletal: Negative for joint swelling.  Skin: Negative for wound.      Allergies  Motrin  Home Medications   Prior to Admission medications   Medication Sig Start Date End Date Taking? Authorizing Provider  albuterol (PROVENTIL HFA;VENTOLIN HFA) 108 (90 BASE) MCG/ACT inhaler Inhale 1-2 puffs into the lungs every 6 (six) hours as needed for wheezing or shortness of breath. 02/23/14   Hope Bunnie Pion, NP  beclomethasone (QVAR) 40 MCG/ACT inhaler Inhale 1 puff into the lungs 2 (two) times daily.    Historical Provider, MD  calcium carbonate (TUMS - DOSED IN MG ELEMENTAL CALCIUM) 500 MG chewable tablet Chew 1 tablet by mouth 4 (four) times daily as needed for indigestion or heartburn.    Historical Provider, MD  cetirizine-pseudoephedrine (ZYRTEC-D) 5-120 MG per tablet Take 1 tablet by mouth daily. At night. Patient not taking: Reported on 10/10/2014 05/27/13   Nehemiah Settle, NP  Docusate Calcium (STOOL SOFTENER PO) Take 2 tablets by mouth every morning.    Historical Provider, MD  fluticasone (FLONASE) 50 MCG/ACT nasal spray Place 2 sprays into both nostrils daily. Patient not taking: Reported on 10/10/2014 05/27/13   Nehemiah Settle, NP  ondansetron (  ZOFRAN) 4 MG tablet Take 1 tablet (4 mg total) by mouth every 6 (six) hours. Patient not taking: Reported on 10/10/2014 05/27/13   Nehemiah Settle, NP  Phenyleph-Promethazine-Cod 5-6.25-10 MG/5ML SYRP Take 5 mLs by mouth every 4 (four) hours as needed. Patient not taking: Reported on 10/10/2014 04/02/13   Ashley Murrain, NP   BP 129/87 mmHg  Pulse 68  Temp(Src) 98.1 F (36.7 C) (Oral)  Resp 20  Ht 5\' 3"  (1.6 m)  Wt 108.863 kg  BMI 42.52 kg/m2  SpO2 100%  LMP 11/04/2015 Physical Exam  Constitutional: She is oriented  to person, place, and time. She appears well-developed and well-nourished. No distress.  HENT:  Head: Normocephalic and atraumatic.  Eyes: Conjunctivae are normal. No scleral icterus.  Neck: Normal range of motion.  Cardiovascular: Normal rate, regular rhythm and normal heart sounds.  Exam reveals no gallop and no friction rub.   No murmur heard. Pulmonary/Chest: Effort normal and breath sounds normal. No respiratory distress. She exhibits tenderness.  Tenderness to palpation along the right lateral rib cage at about the seventh and eighth rib. No bruising, crepitus, step off.  Abdominal: Soft. Bowel sounds are normal. She exhibits no distension and no mass. There is no tenderness. There is no guarding.  Musculoskeletal:  TTP R trapezius  Neurological: She is alert and oriented to person, place, and time.  Skin: Skin is warm and dry. She is not diaphoretic.  Nursing note and vitals reviewed.   ED Course  Procedures (including critical care time) Labs Review Labs Reviewed - No data to display  Imaging Review No results found. I have personally reviewed and evaluated these images and lab results as part of my medical decision-making.   EKG Interpretation None      MDM   Final diagnoses:  None   PATIENT WITH RIB CONTUSION. PERC NEGATIVE. NO CONCERN FOR CAP AS SHE IS AFEBRILE AND WITHOUT COUGH. TTP IN THE RIBS. COMFORT MEASURES WITH RIB BELT. ALTHOUGH THIS IS LIKELY CONTUSION SHE COULD HAVE OCCULT RIB FRACTURE AND AS SUCH Definitive Fracture Care PROVIDED.  Definitive fracture care performred for the RIB  This included analgesia AND, DEEP BREATHING TECHNIQUES.  I have counseled the pt on possible complications of the fractures and signs and symptoms which would mandate return for further care.   The patient was counseled on the dangers of tobacco use, and was advised to quit.  Reviewed strategies to maximize success, including removing cigarettes and smoking materials from  environment, stress management, substitution of other forms of reinforcement, support of family/friends and written materials.  SLIGHT WHEEZING AND OUT OF INHALER. ALBUTEROL PROVIDED.  The patient appears reasonably screened and/or stabilized for discharge and I doubt any other medical condition or other [facility] requiring further screening, evaluation, or treatment in the ED at this time prior to discharge.    Margarita Mail, PA-C 11/14/15 [place], MD 11/14/15 [phone]

## 2016-07-07 ENCOUNTER — Encounter: Payer: Self-pay | Admitting: Internal Medicine

## 2016-07-28 ENCOUNTER — Ambulatory Visit: Payer: Medicaid Other | Admitting: Gastroenterology

## 2016-07-28 ENCOUNTER — Encounter: Payer: Self-pay | Admitting: Gastroenterology

## 2016-07-28 ENCOUNTER — Telehealth: Payer: Self-pay | Admitting: Gastroenterology

## 2016-07-28 NOTE — Telephone Encounter (Signed)
PT WAS A NO SHOW AND LETTER SENT  °

## 2016-08-04 ENCOUNTER — Encounter (HOSPITAL_COMMUNITY): Payer: Self-pay | Admitting: Emergency Medicine

## 2016-08-04 ENCOUNTER — Emergency Department (HOSPITAL_COMMUNITY)
Admission: EM | Admit: 2016-08-04 | Discharge: 2016-08-04 | Disposition: A | Discharge status: Home / Self Care | Payer: Medicaid Other | Attending: Dermatology | Admitting: Dermatology

## 2016-08-04 DIAGNOSIS — Z79899 Other long term (current) drug therapy: Secondary | ICD-10-CM | POA: Diagnosis not present

## 2016-08-04 DIAGNOSIS — H9201 Otalgia, right ear: Secondary | ICD-10-CM | POA: Insufficient documentation

## 2016-08-04 DIAGNOSIS — Z5321 Procedure and treatment not carried out due to patient leaving prior to being seen by health care provider: Secondary | ICD-10-CM | POA: Insufficient documentation

## 2016-08-04 DIAGNOSIS — R0981 Nasal congestion: Secondary | ICD-10-CM | POA: Insufficient documentation

## 2016-08-04 DIAGNOSIS — J45909 Unspecified asthma, uncomplicated: Secondary | ICD-10-CM | POA: Diagnosis not present

## 2016-08-04 DIAGNOSIS — F17210 Nicotine dependence, cigarettes, uncomplicated: Secondary | ICD-10-CM | POA: Insufficient documentation

## 2016-08-04 HISTORY — DX: Other allergic rhinitis: J30.89

## 2016-08-04 NOTE — ED Notes (Signed)
Attempted to call pt to bring to ED room, no response to name call in waiting room.

## 2016-08-04 NOTE — ED Triage Notes (Signed)
Patient c/o right ear pain with thick yellow drainage x2 days. Denies any fevers. Per patient nasal congestion despite using Flonase and Claritin.

## 2016-09-15 ENCOUNTER — Other Ambulatory Visit: Payer: Self-pay

## 2016-09-15 ENCOUNTER — Ambulatory Visit (INDEPENDENT_AMBULATORY_CARE_PROVIDER_SITE_OTHER): Payer: Medicaid Other | Admitting: Nurse Practitioner

## 2016-09-15 ENCOUNTER — Ambulatory Visit (HOSPITAL_COMMUNITY)
Admission: RE | Admit: 2016-09-15 | Discharge: 2016-09-15 | Disposition: A | Discharge status: Home / Self Care | Payer: Medicaid Other | Source: Ambulatory Visit | Attending: Nurse Practitioner | Admitting: Nurse Practitioner

## 2016-09-15 ENCOUNTER — Encounter: Payer: Self-pay | Admitting: Nurse Practitioner

## 2016-09-15 DIAGNOSIS — R195 Other fecal abnormalities: Secondary | ICD-10-CM | POA: Insufficient documentation

## 2016-09-15 DIAGNOSIS — K5900 Constipation, unspecified: Secondary | ICD-10-CM | POA: Insufficient documentation

## 2016-09-15 DIAGNOSIS — K625 Hemorrhage of anus and rectum: Secondary | ICD-10-CM | POA: Insufficient documentation

## 2016-09-15 MED ORDER — LINACLOTIDE 145 MCG PO CAPS: 145.0000 ug | ORAL_CAPSULE | Freq: Every day | ORAL | 0 refills | Status: DC

## 2016-09-15 MED ORDER — PEG-KCL-NACL-NASULF-NA ASC-C 100 G PO SOLR: 1.0000 | ORAL | 0 refills | Status: DC

## 2016-09-15 NOTE — Progress Notes (Signed)
Primary Care Physician:  Raiford Simmonds., PA-C Primary Gastroenterologist:  Dr. Gala Romney  Chief Complaint  Patient presents with  . Rectal Bleeding    bright red blood, approx 3 yrs, sometimes has clots and no bm, sometimes mucous like  . Diarrhea  . Constipation  . Abdominal Pain    occ, lower mid abd    HPI:   Sabrina Perez is a 26 y.o. female who presents On referral from primary care for rectal bleeding. PCP notes reviewed, noted rectal bleeding for 2 years and the majority of her bowel movements. During her previous office visit she did have heme positive stool. The first occurrence of this was while pregnant. Occasional rectal pain with defecation. Denied constipation or hemorrhoids at her last visit. Hemoglobin has been normal. She is never had a colonoscopy before.  Today she states she's doing well overall. Confirms she has had rectal bleeding for about the past 2-3 years, started with pregnancy and attributed to likely hemorrhoids. Hasn't stopped sinc.e. Had a rectal exam at the [facility] which she states was negative for hemorrhoids but was heme+. At times will think she has to have a bowel movement and will only pass clots. Denies anemia. Has some rare abdominal pain lower abdomen. Recently has been having a bowel movement about once a week, previously would go every morning and this changed within the past year. Abdominal pain improves after a good bowel movement. Denies unintentional weight loss. Has been under a lot of stress recently. Did have an episode of significant obstipation a few weeks ago requiring laxatives, water, enema, etc. Finally did have a bowel movement which was painful and hurt for about a week. Denies chest pain, dyspnea, dizziness, lightheadedness, syncope, near syncope. Denies any other upper or lower GI symptoms.  Past Medical History:  Diagnosis Date  . Asthma   . Environmental and seasonal allergies   . GERD (gastroesophageal reflux disease)      Past Surgical History:  Procedure Laterality Date  . WISDOM TOOTH EXTRACTION      Current Outpatient Prescriptions  Medication Sig Dispense Refill  . albuterol (PROVENTIL HFA;VENTOLIN HFA) 108 (90 BASE) MCG/ACT inhaler Inhale 1-2 puffs into the lungs every 6 (six) hours as needed for wheezing or shortness of breath. 1 Inhaler 0  . beclomethasone (QVAR) 40 MCG/ACT inhaler Inhale 1 puff into the lungs 2 (two) times daily.    . calcium carbonate (TUMS - DOSED IN MG ELEMENTAL CALCIUM) 500 MG chewable tablet Chew 1 tablet by mouth 4 (four) times daily as needed for indigestion or heartburn.    . fluticasone (FLONASE) 50 MCG/ACT nasal spray Place 2 sprays into both nostrils daily. (Patient taking differently: Place 2 sprays into both nostrils daily as needed. ) 16 g 2  . loratadine (CLARITIN) 10 MG tablet Take 10 mg by mouth daily.     No current facility-administered medications for this visit.     Allergies as of 09/15/2016 - Review Complete 09/15/2016  Allergen Reaction Noted  . Motrin [ibuprofen] Palpitations 11/06/2012    Family History  Problem Relation Age of Onset  . Colon cancer Neg Hx   . Colon polyps Neg Hx     Social History   Social History  . Marital status: Single    Spouse name: N/A  . Number of children: N/A  . Years of education: N/A   Occupational History  . Not on file.   Social History Main Topics  . Smoking status: Current Every Day  Smoker    Packs/day: 0.50    Years: 4.00    Types: Cigarettes  . Smokeless tobacco: Never Used  . Alcohol use Yes     Comment: occasional  . Drug use: No  . Sexual activity: Yes    Birth control/ protection: None   Other Topics Concern  . Not on file   Social History Narrative  . No narrative on file    Review of Systems: General: Negative for anorexia, weight loss, fever, chills, fatigue, weakness. Eyes: Negative for vision changes.  ENT: Negative for hoarseness, difficulty swallowing. CV: Negative for  chest pain, angina, palpitations, peripheral edema.  Respiratory: Negative for dyspnea at rest, cough, sputum, wheezing.  GI: See history of present illness. MS: Negative for joint pain, low back pain.  Endo: Negative for unusual weight change.  Heme: Negative for bruising or bleeding. Allergy: Negative for rash or hives.    Physical Exam: BP (!) 140/94   Pulse 96   Temp 97.2 F (36.2 C) (Oral)   Ht 5' 2.75" (1.594 m)   Wt 224 lb 12.8 oz (102 kg)   BMI 40.14 kg/m  General:   Obese female. Alert and oriented. Pleasant and cooperative. Well-nourished and well-developed.  Head:  Normocephalic and atraumatic. Eyes:  Without icterus, sclera clear and conjunctiva pink.  Ears:  Normal auditory acuity. Cardiovascular:  S1, S2 present without murmurs appreciated. Extremities without clubbing or edema. Respiratory:  Clear to auscultation bilaterally. No wheezes, rales, or rhonchi. No distress.  Gastrointestinal:  +BS, soft, and non-distended. Mild abdominal TTP noted lower abdomen. No HSM noted. No guarding or rebound. No masses appreciated.  Rectal:  Deferred  Musculoskalatal:  Symmetrical without gross deformities. Neurologic:  Alert and oriented x4;  grossly normal neurologically. Psych:  Alert and cooperative. Normal mood and affect. Heme/Lymph/Immune: No excessive bruising noted.    09/15/2016 10:10 AM   Disclaimer: This note was dictated with voice recognition software. Similar sounding words can inadvertently be transcribed and may not be corrected upon review.

## 2016-09-15 NOTE — Patient Instructions (Signed)
1. We will schedule your colonoscopy for you 2. Have your abdominal XRay done when you're able to 3. When we call your XRay results, if normal, start Linzess 145 mcg daily on an empty stomach 4. I am providing samples of Linzess to last 2 weeks. Call us in 1-2 weeks and let us know if it is helping 5. Further recommendations to be based on the results of your colonoscopy 6. Follow-up for a repeat office visit in 3 months.

## 2016-09-15 NOTE — Assessment & Plan Note (Signed)
The patient describes significant constipation with a bowel movement about once a week. This is new for her as of the past year. Given this and at least 1 episode of obstipation recently I will check an abdominal x-ray for stricturing, narrowing, colonic obstruction. If she is not having any obstructive pathologies I will start her on Linzess 145 g once a day with samples to last 2 weeks and request a progress report in 1-2 weeks. Colonoscopy as per above.

## 2016-09-15 NOTE — Assessment & Plan Note (Signed)
Heme positive stool on rectal exam without department given complaints of long-standing rectal bleeding. No hemorrhoids found, per the patient. Abdominal x-ray, Linzess, colonoscopy as per above. Return for follow-up in 3 months.

## 2016-09-15 NOTE — Progress Notes (Signed)
CC'ED TO PCP 

## 2016-09-15 NOTE — Assessment & Plan Note (Signed)
Noted rectal bleeding for the past 2-3 years with most bowel movements. Has had a few episodes where she feels the need to have a bowel movement and only passes clots and blood. She denies any hemorrhoid symptoms. States at the health department they did a rectal exam and did not find any hemorrhoids. Her symptoms [place] far have been attributed to hemorrhoids. At this point we'll proceed with a colonoscopy to further evaluate. Low likelihood of colorectal cancer and someone of this age but not be on the realm of possibility. Return for follow-up in 3 months.  Proceed with TCS on propofol/MAC with Dr. Gala Romney in near future: the risks, benefits, and alternatives have been discussed with the patient in detail. The patient states understanding and desires to proceed.  The patient is not on any anticoagulants, anxiolytics, chronic pain medications, or antidepressants. However, she is obese and very young at age 26. Given her likely difficult sedation with her young age and obesity we will provide with propofol/MAC to promote adequate sedation.

## 2016-09-28 ENCOUNTER — Telehealth: Payer: Self-pay | Admitting: Internal Medicine

## 2016-09-28 DIAGNOSIS — R195 Other fecal abnormalities: Secondary | ICD-10-CM

## 2016-09-28 DIAGNOSIS — K5900 Constipation, unspecified: Secondary | ICD-10-CM

## 2016-09-28 DIAGNOSIS — K625 Hemorrhage of anus and rectum: Secondary | ICD-10-CM

## 2016-09-28 NOTE — Telephone Encounter (Signed)
Spoke with the pt, she is still seeing blood about the same as before, ? A little better but not much. She said it was still bright red blood but now she doesn't see it with every bm maybe every other bm. Stools are soft and has some diarrhea at times.  Pt went 3 days with out a bm when she first started the medication but now is going every other day. Pt took her last sample this morning and wanted to know if she should continue? If so, please send in rx to [facility].

## 2016-09-28 NOTE — Telephone Encounter (Signed)
Tried to call pt to get more information- NA-No voicemail

## 2016-09-28 NOTE — Telephone Encounter (Signed)
Pt has been taking the linzess samples and called to give a report on how they were doing. She said it was helping her some with going to the bathroom, but she still has a lot of blood in her stools. She doesn't know if she needs to continue taking this or if she needs a prescription called in. Please advise and call [phone]

## 2016-09-29 MED ORDER — LINACLOTIDE 145 MCG PO CAPS
145.0000 ug | ORAL_CAPSULE | Freq: Every day | ORAL | 3 refills | Status: DC
Start: 2016-09-29 — End: 2016-12-08

## 2016-09-29 NOTE — Telephone Encounter (Signed)
I tried to call the patient and I was unable to leave a message

## 2016-09-29 NOTE — Addendum Note (Signed)
Addended by: Gordy Levan, Tocarra Gassen A on: 09/29/2016 08:18 AM   Modules accepted: Orders

## 2016-09-29 NOTE — Telephone Encounter (Signed)
Please tell her to yes keep taking the Linzess. We'll give it some more time and see how she's doing. She has a follow-up in about 2 months. If any severe bleeding, let us know. Let's have her check a CBC just for good measure. We'll call with results. I am sending in an Rx for Linzess.

## 2016-09-30 ENCOUNTER — Other Ambulatory Visit: Payer: Self-pay | Admitting: Nurse Practitioner

## 2016-09-30 ENCOUNTER — Other Ambulatory Visit: Payer: Self-pay

## 2016-09-30 DIAGNOSIS — K625 Hemorrhage of anus and rectum: Secondary | ICD-10-CM

## 2016-09-30 LAB — CBC WITH DIFFERENTIAL/PLATELET
BASOS PCT: 0 %
Basophils Absolute: 0 cells/uL (ref 0–200)
Eosinophils Absolute: 160 cells/uL (ref 15–500)
Eosinophils Relative: 2 %
HCT: 43.2 % (ref 35.0–45.0)
Hemoglobin: 14.3 g/dL (ref 11.7–15.5)
LYMPHS PCT: 33 %
Lymphs Abs: 2640 cells/uL (ref 850–3900)
MCH: 32.6 pg (ref 27.0–33.0)
MCHC: 33.1 g/dL (ref 32.0–36.0)
MCV: 98.4 fL (ref 80.0–100.0)
MONO ABS: 640 {cells}/uL (ref 200–950)
MPV: 8.5 fL (ref 7.5–12.5)
Monocytes Relative: 8 %
NEUTROS ABS: 4560 {cells}/uL (ref 1500–7800)
Neutrophils Relative %: 57 %
Platelets: 399 10*3/uL (ref 140–400)
RBC: 4.39 MIL/uL (ref 3.80–5.10)
RDW: 13.8 % (ref 11.0–15.0)
WBC: 8 10*3/uL (ref 3.8–10.8)

## 2016-09-30 NOTE — Telephone Encounter (Signed)
Patient is aware and she's on her way to [facility] to do blood work

## 2016-09-30 NOTE — Telephone Encounter (Signed)
Lab order done 

## 2016-10-01 ENCOUNTER — Telehealth: Payer: Self-pay | Admitting: Internal Medicine

## 2016-10-01 NOTE — Telephone Encounter (Signed)
rx was put in as a sample. I have called in the rx to [facility] and left it on their voicemail. Pt is aware.

## 2016-10-01 NOTE — Telephone Encounter (Signed)
Pt's pharmacy hasn't received the Linzess prescription.  There isn't a confirmation that the pharmacy has received it yet.

## 2016-10-26 ENCOUNTER — Other Ambulatory Visit: Payer: Self-pay

## 2016-10-26 ENCOUNTER — Telehealth: Payer: Self-pay

## 2016-10-26 MED ORDER — PEG-KCL-NACL-NASULF-NA ASC-C 100 G PO SOLR: 1.0000 | ORAL | 0 refills | Status: DC

## 2016-10-26 NOTE — Patient Instructions (Signed)
Sabrina Perez  10/26/2016     @PREFPERIOPPHARMACY @   Your procedure is scheduled on  10/29/2016   Report to [name] at  1245  P.M.  Call this number if you have problems the morning of surgery:  [phone]   Remember:  Do not eat food or drink liquids after midnight.  Take these medicines the morning of surgery with A SIP OF WATER  claritin.   Do not wear jewelry, make-up or nail polish.  Do not wear lotions, powders, or perfumes, or deoderant.  Do not shave 48 hours prior to surgery.  Men may shave face and neck.  Do not bring valuables to the hospital.  [facility] is not responsible for any belongings or valuables.  Contacts, dentures or bridgework may not be worn into surgery.  Leave your suitcase in the car.  After surgery it may be brought to your room.  For patients admitted to the hospital, discharge time will be determined by your treatment team.  Patients discharged the day of surgery will not be allowed to drive home.   Name and phone number of your driver:   family Special instructions:  Follow the diet and prep instructions given to you by Dr [name] office.  Please read over the following fact sheets that you were given. Anesthesia Post-op Instructions and Care and Recovery After Surgery       Colonoscopy, Adult A colonoscopy is an exam to look at the entire large intestine. During the exam, a lubricated, bendable tube is inserted into the anus and then passed into the rectum, colon, and other parts of the large intestine. A colonoscopy is often done as a part of normal colorectal screening or in response to certain symptoms, such as anemia, persistent diarrhea, abdominal pain, and blood in the stool. The exam can help screen for and diagnose medical problems, including:  Tumors.  Polyps.  Inflammation.  Areas of bleeding.  Tell a health care provider about:  Any allergies you have.  All medicines you are taking, including  vitamins, herbs, eye drops, creams, and over-the-counter medicines.  Any problems you or family members have had with anesthetic medicines.  Any blood disorders you have.  Any surgeries you have had.  Any medical conditions you have.  Any problems you have had passing stool. What are the risks? Generally, this is a safe procedure. However, problems may occur, including:  Bleeding.  A tear in the intestine.  A reaction to medicines given during the exam.  Infection (rare).  What happens before the procedure? Eating and drinking restrictions Follow instructions from your health care provider about eating and drinking, which may include:  A few days before the procedure - follow a low-fiber diet. Avoid nuts, seeds, dried fruit, raw fruits, and vegetables.  1-3 days before the procedure - follow a clear liquid diet. Drink only clear liquids, such as clear broth or bouillon, black coffee or tea, clear juice, clear soft drinks or sports drinks, gelatin dessert, and popsicles. Avoid any liquids that contain red or purple dye.  On the day of the procedure - do not eat or drink anything during the 2 hours before the procedure, or within the time period that your health care provider recommends.  Bowel prep If you were prescribed an oral bowel prep to clean out your colon:  Take it as told by your health care provider. Starting the day before your procedure, you will  need to drink a large amount of medicated liquid. The liquid will cause you to have multiple loose stools until your stool is almost clear or light green.  If your skin or anus gets irritated from diarrhea, you may use these to relieve the irritation: ? Medicated wipes, such as adult wet wipes with aloe and vitamin E. ? A skin soothing-product like petroleum jelly.  If you vomit while drinking the bowel prep, take a break for up to 60 minutes and then begin the bowel prep again. If vomiting continues and you cannot take  the bowel prep without vomiting, call your health care provider.  General instructions  Ask your health care provider about changing or stopping your regular medicines. This is especially important if you are taking diabetes medicines or blood thinners.  Plan to have someone take you home from the hospital or clinic. What happens during the procedure?  An IV tube may be inserted into one of your veins.  You will be given medicine to help you relax (sedative).  To reduce your risk of infection: ? Your health care team will wash or sanitize their hands. ? Your anal area will be washed with soap.  You will be asked to lie on your side with your knees bent.  Your health care provider will lubricate a long, thin, flexible tube. The tube will have a camera and a light on the end.  The tube will be inserted into your anus.  The tube will be gently eased through your rectum and colon.  Air will be delivered into your colon to keep it open. You may feel some pressure or cramping.  The camera will be used to take images during the procedure.  A small tissue sample may be removed from your body to be examined under a microscope (biopsy). If any potential problems are found, the tissue will be sent to a lab for testing.  If small polyps are found, your health care provider may remove them and have them checked for cancer cells.  The tube that was inserted into your anus will be slowly removed. The procedure may vary among health care providers and hospitals. What happens after the procedure?  Your blood pressure, heart rate, breathing rate, and blood oxygen level will be monitored until the medicines you were given have worn off.  Do not drive for 24 hours after the exam.  You may have a small amount of blood in your stool.  You may pass gas and have mild abdominal cramping or bloating due to the air that was used to inflate your colon during the exam.  It is up to you to get the  results of your procedure. Ask your health care provider, or the department performing the procedure, when your results will be ready. This information is not intended to replace advice given to you by your health care provider. Make sure you discuss any questions you have with your health care provider. Document Released: 05/08/2000 Document Revised: 03/11/2016 Document Reviewed: 07/23/2015 Elsevier Interactive Patient Education  2018 [employer].  Colonoscopy, Adult, Care After This sheet gives you information about how to care for yourself after your procedure. Your health care provider may also give you more specific instructions. If you have problems or questions, contact your health care provider. What can I expect after the procedure? After the procedure, it is common to have:  A small amount of blood in your stool for 24 hours after the procedure.  Some gas.  Mild abdominal cramping or bloating.  Follow these instructions at home: General instructions   For the first 24 hours after the procedure: ? Do not drive or use machinery. ? Do not sign important documents. ? Do not drink alcohol. ? Do your regular daily activities at a slower pace than normal. ? Eat soft, easy-to-digest foods. ? Rest often.  Take over-the-counter or prescription medicines only as told by your health care provider.  It is up to you to get the results of your procedure. Ask your health care provider, or the department performing the procedure, when your results will be ready. Relieving cramping and bloating  Try walking around when you have cramps or feel bloated.  Apply heat to your abdomen as told by your health care provider. Use a heat source that your health care provider recommends, such as a moist heat pack or a heating pad. ? Place a towel between your skin and the heat source. ? Leave the heat on for 20-30 minutes. ? Remove the heat if your skin turns bright red. This is especially  important if you are unable to feel pain, heat, or cold. You may have a greater risk of getting burned. Eating and drinking  Drink enough fluid to keep your urine clear or pale yellow.  Resume your normal diet as instructed by your health care provider. Avoid heavy or fried foods that are hard to digest.  Avoid drinking alcohol for as long as instructed by your health care provider. Contact a health care provider if:  You have blood in your stool 2-3 days after the procedure. Get help right away if:  You have more than a small spotting of blood in your stool.  You pass large blood clots in your stool.  Your abdomen is swollen.  You have nausea or vomiting.  You have a fever.  You have increasing abdominal pain that is not relieved with medicine. This information is not intended to replace advice given to you by your health care provider. Make sure you discuss any questions you have with your health care provider. Document Released: 12/24/2003 Document Revised: 02/03/2016 Document Reviewed: 07/23/2015 Elsevier Interactive Patient Education  2018 [place] Anesthesia is a term that refers to techniques, procedures, and medicines that help a person stay safe and comfortable during a medical procedure. Monitored anesthesia care, or sedation, is one type of anesthesia. Your anesthesia specialist may recommend sedation if you will be having a procedure that does not require you to be unconscious, such as:  Cataract surgery.  A dental procedure.  A biopsy.  A colonoscopy.  During the procedure, you may receive a medicine to help you relax (sedative). There are three levels of sedation:  Mild sedation. At this level, you may feel awake and relaxed. You will be able to follow directions.  Moderate sedation. At this level, you will be sleepy. You may not remember the procedure.  Deep sedation. At this level, you will be asleep. You will not remember  the procedure.  The more medicine you are given, the deeper your level of sedation will be. Depending on how you respond to the procedure, the anesthesia specialist may change your level of sedation or the type of anesthesia to fit your needs. An anesthesia specialist will monitor you closely during the procedure. Let your health care provider know about:  Any allergies you have.  All medicines you are taking, including vitamins, herbs, eye drops, creams, and over-the-counter medicines.  Any use of steroids (by mouth or as a cream).  Any problems you or family members have had with sedatives and anesthetic medicines.  Any blood disorders you have.  Any surgeries you have had.  Any medical conditions you have, such as sleep apnea.  Whether you are pregnant or may be pregnant.  Any use of cigarettes, alcohol, or street drugs. What are the risks? Generally, this is a safe procedure. However, problems may occur, including:  Getting too much medicine (oversedation).  Nausea.  Allergic reaction to medicines.  Trouble breathing. If this happens, a breathing tube may be used to help with breathing. It will be removed when you are awake and breathing on your own.  Heart trouble.  Lung trouble.  Before the procedure Staying hydrated Follow instructions from your health care provider about hydration, which may include:  Up to 2 hours before the procedure - you may continue to drink clear liquids, such as water, clear fruit juice, black coffee, and plain tea.  Eating and drinking restrictions Follow instructions from your health care provider about eating and drinking, which may include:  8 hours before the procedure - stop eating heavy meals or foods such as meat, fried foods, or fatty foods.  6 hours before the procedure - stop eating light meals or foods, such as toast or cereal.  6 hours before the procedure - stop drinking milk or drinks that contain milk.  2 hours before  the procedure - stop drinking clear liquids.  Medicines Ask your health care provider about:  Changing or stopping your regular medicines. This is especially important if you are taking diabetes medicines or blood thinners.  Taking medicines such as aspirin and ibuprofen. These medicines can thin your blood. Do not take these medicines before your procedure if your health care provider instructs you not to.  Tests and exams  You will have a physical exam.  You may have blood tests done to show: ? How well your kidneys and liver are working. ? How well your blood can clot.  General instructions  Plan to have someone take you home from the hospital or clinic.  If you will be going home right after the procedure, plan to have someone with you for 24 hours.  What happens during the procedure?  Your blood pressure, heart rate, breathing, level of pain and overall condition will be monitored.  An IV tube will be inserted into one of your veins.  Your anesthesia specialist will give you medicines as needed to keep you comfortable during the procedure. This may mean changing the level of sedation.  The procedure will be performed. After the procedure  Your blood pressure, heart rate, breathing rate, and blood oxygen level will be monitored until the medicines you were given have worn off.  Do not drive for 24 hours if you received a sedative.  You may: ? Feel sleepy, clumsy, or nauseous. ? Feel forgetful about what happened after the procedure. ? Have a sore throat if you had a breathing tube during the procedure. ? Vomit. This information is not intended to replace advice given to you by your health care provider. Make sure you discuss any questions you have with your health care provider. Document Released: 02/04/2005 Document Revised: 10/18/2015 Document Reviewed: 09/01/2015 Elsevier Interactive Patient Education  2018 [place], Care  After These instructions provide you with information about caring for yourself after your procedure. Your health care provider may also give you more  specific instructions. Your treatment has been planned according to current medical practices, but problems sometimes occur. Call your health care provider if you have any problems or questions after your procedure. What can I expect after the procedure? After your procedure, it is common to:  Feel sleepy for several hours.  Feel clumsy and have poor balance for several hours.  Feel forgetful about what happened after the procedure.  Have poor judgment for several hours.  Feel nauseous or vomit.  Have a sore throat if you had a breathing tube during the procedure.  Follow these instructions at home: For at least 24 hours after the procedure:   Do not: ? Participate in activities in which you could fall or become injured. ? Drive. ? Use heavy machinery. ? Drink alcohol. ? Take sleeping pills or medicines that cause drowsiness. ? Make important decisions or sign legal documents. ? Take care of children on your own.  Rest. Eating and drinking  Follow the diet that is recommended by your health care provider.  If you vomit, drink water, juice, or soup when you can drink without vomiting.  Make sure you have little or no nausea before eating solid foods. General instructions  Have a responsible adult stay with you until you are awake and alert.  Take over-the-counter and prescription medicines only as told by your health care provider.  If you smoke, do not smoke without supervision.  Keep all follow-up visits as told by your health care provider. This is important. Contact a health care provider if:  You keep feeling nauseous or you keep vomiting.  You feel light-headed.  You develop a rash.  You have a fever. Get help right away if:  You have trouble breathing. This information is not intended to replace advice  given to you by your health care provider. Make sure you discuss any questions you have with your health care provider. Document Released: 09/01/2015 Document Revised: 01/01/2016 Document Reviewed: 09/01/2015 Elsevier Interactive Patient Education  [employer].

## 2016-10-26 NOTE — Telephone Encounter (Signed)
Pt was set up for TCS on 11/04/16 but RMR is off that day, I talked with her and we have moved her to 10/29/16 @ 2:15 pm. She is aware and I when over the new date and times with her. She will keep the same pre-op appointment as before.

## 2016-10-28 ENCOUNTER — Encounter (HOSPITAL_COMMUNITY)
Admission: RE | Admit: 2016-10-28 | Discharge: 2016-10-28 | Disposition: A | Discharge status: Home / Self Care | Payer: Medicaid Other | Source: Ambulatory Visit | Attending: Internal Medicine | Admitting: Internal Medicine

## 2016-10-28 ENCOUNTER — Encounter (HOSPITAL_COMMUNITY): Payer: Self-pay

## 2016-10-28 DIAGNOSIS — Z01812 Encounter for preprocedural laboratory examination: Secondary | ICD-10-CM | POA: Diagnosis present

## 2016-10-28 LAB — PREGNANCY, URINE: PREG TEST UR: NEGATIVE

## 2016-10-29 ENCOUNTER — Ambulatory Visit (HOSPITAL_COMMUNITY): Payer: Medicaid Other | Admitting: Anesthesiology

## 2016-10-29 ENCOUNTER — Encounter (HOSPITAL_COMMUNITY)
Admission: RE | Disposition: A | Discharge status: Home / Self Care | Payer: Self-pay | Source: Ambulatory Visit | Attending: Internal Medicine

## 2016-10-29 ENCOUNTER — Encounter (HOSPITAL_COMMUNITY): Payer: Self-pay

## 2016-10-29 ENCOUNTER — Ambulatory Visit (HOSPITAL_COMMUNITY)
Admission: RE | Admit: 2016-10-29 | Discharge: 2016-10-29 | Disposition: A | Discharge status: Home / Self Care | Payer: Medicaid Other | Source: Ambulatory Visit | Attending: Internal Medicine | Admitting: Internal Medicine

## 2016-10-29 DIAGNOSIS — J45909 Unspecified asthma, uncomplicated: Secondary | ICD-10-CM | POA: Diagnosis not present

## 2016-10-29 DIAGNOSIS — D125 Benign neoplasm of sigmoid colon: Secondary | ICD-10-CM | POA: Insufficient documentation

## 2016-10-29 DIAGNOSIS — Z79899 Other long term (current) drug therapy: Secondary | ICD-10-CM | POA: Insufficient documentation

## 2016-10-29 DIAGNOSIS — K625 Hemorrhage of anus and rectum: Secondary | ICD-10-CM

## 2016-10-29 DIAGNOSIS — K921 Melena: Secondary | ICD-10-CM | POA: Diagnosis not present

## 2016-10-29 DIAGNOSIS — K5900 Constipation, unspecified: Secondary | ICD-10-CM | POA: Diagnosis not present

## 2016-10-29 DIAGNOSIS — F17210 Nicotine dependence, cigarettes, uncomplicated: Secondary | ICD-10-CM | POA: Insufficient documentation

## 2016-10-29 DIAGNOSIS — K219 Gastro-esophageal reflux disease without esophagitis: Secondary | ICD-10-CM | POA: Insufficient documentation

## 2016-10-29 DIAGNOSIS — Z7951 Long term (current) use of inhaled steroids: Secondary | ICD-10-CM | POA: Insufficient documentation

## 2016-10-29 HISTORY — PX: POLYPECTOMY: SHX5525

## 2016-10-29 HISTORY — PX: COLONOSCOPY WITH PROPOFOL: SHX5780

## 2016-10-29 SURGERY — COLONOSCOPY WITH PROPOFOL
Anesthesia: Monitor Anesthesia Care

## 2016-10-29 MED ORDER — FENTANYL CITRATE (PF) 100 MCG/2ML IJ SOLN
INTRAMUSCULAR | Status: AC
  Filled 2016-10-29: qty 2

## 2016-10-29 MED ORDER — MIDAZOLAM HCL 2 MG/2ML IJ SOLN
INTRAMUSCULAR | Status: AC
  Filled 2016-10-29: qty 2

## 2016-10-29 MED ORDER — PROPOFOL 10 MG/ML IV BOLUS
INTRAVENOUS | Status: AC
  Filled 2016-10-29: qty 20

## 2016-10-29 MED ORDER — CHLORHEXIDINE GLUCONATE CLOTH 2 % EX PADS: 6.0000 | MEDICATED_PAD | Freq: Once | CUTANEOUS | Status: DC

## 2016-10-29 MED ORDER — PROPOFOL 500 MG/50ML IV EMUL
INTRAVENOUS | Status: DC | PRN
  Administered 2016-10-29: 125 ug/kg/min via INTRAVENOUS

## 2016-10-29 MED ORDER — MIDAZOLAM HCL 2 MG/2ML IJ SOLN
1.0000 mg | INTRAMUSCULAR | Status: AC
Start: 2016-10-29 — End: 2016-10-29
  Administered 2016-10-29: 2 mg via INTRAVENOUS

## 2016-10-29 MED ORDER — MIDAZOLAM HCL 5 MG/5ML IJ SOLN
INTRAMUSCULAR | Status: DC | PRN
  Administered 2016-10-29: 2 mg via INTRAVENOUS

## 2016-10-29 MED ORDER — IPRATROPIUM-ALBUTEROL 0.5-2.5 (3) MG/3ML IN SOLN
3.0000 mL | Freq: Once | RESPIRATORY_TRACT | Status: AC
  Administered 2016-10-29: 3 mL via RESPIRATORY_TRACT

## 2016-10-29 MED ORDER — LACTATED RINGERS IV SOLN
INTRAVENOUS | Status: DC
  Administered 2016-10-29: 12:00:00 via INTRAVENOUS

## 2016-10-29 MED ORDER — FENTANYL CITRATE (PF) 100 MCG/2ML IJ SOLN
25.0000 ug | Freq: Once | INTRAMUSCULAR | Status: AC
  Administered 2016-10-29: 25 ug via INTRAVENOUS

## 2016-10-29 MED ORDER — IPRATROPIUM-ALBUTEROL 0.5-2.5 (3) MG/3ML IN SOLN
RESPIRATORY_TRACT | Status: AC
  Filled 2016-10-29: qty 3

## 2016-10-29 NOTE — Transfer of Care (Signed)
Immediate Anesthesia Transfer of Care Note  Patient: Sabrina Perez  Procedure(s) Performed: Procedure(s) with comments: COLONOSCOPY WITH PROPOFOL (N/A) - 1230  POLYPECTOMY - colon  Patient Location: PACU  Anesthesia Type:MAC  Level of Consciousness: awake and patient cooperative  Airway & Oxygen Therapy: Patient Spontanous Breathing and Patient connected to face mask oxygen  Post-op Assessment: Report given to RN and Post -op Vital signs reviewed and stable  Post vital signs: Reviewed and stable  Last Vitals:  Vitals:   10/29/16 1140 10/29/16 1145  BP:  115/74  Pulse:    Resp: (!) 22 16  Temp:      Last Pain:  Vitals:   10/29/16 1118  TempSrc: Oral         Complications: No apparent anesthesia complications

## 2016-10-29 NOTE — H&P (Signed)
@LOGO @   Primary Care Physician:  [facility] Primary Gastroenterologist:  Dr. Gala Romney  Pre-Procedure History & Physical: HPI:  Sabrina Perez is a 26 y.o. female here for further evaluation of rectal bleeding via colonoscopy. Started Linzess recently. Helps her go to the bathroom regularly but often produces diarrhea. Moderate stool load in colon.  Past Medical History:  Diagnosis Date  . Asthma   . Environmental and seasonal allergies   . GERD (gastroesophageal reflux disease)     Past Surgical History:  Procedure Laterality Date  . WISDOM TOOTH EXTRACTION      Prior to Admission medications   Medication Sig Start Date End Date Taking? Authorizing Provider  albuterol (PROVENTIL HFA;VENTOLIN HFA) 108 (90 BASE) MCG/ACT inhaler Inhale 1-2 puffs into the lungs every 6 (six) hours as needed for wheezing or shortness of breath. 02/23/14  Yes Neese, [place] M, NP  beclomethasone (QVAR) 40 MCG/ACT inhaler Inhale 1 puff into the lungs 2 (two) times daily.   Yes [provider]  calcium carbonate (TUMS - DOSED IN MG ELEMENTAL CALCIUM) 500 MG chewable tablet Chew 1-2 tablets by mouth 4 (four) times daily as needed for indigestion or heartburn.    Yes [provider]  fluticasone (FLONASE) 50 MCG/ACT nasal spray Place 2 sprays into both nostrils daily. Patient taking differently: Place 2 sprays into both nostrils daily as needed (for allergies.).  05/27/13  Yes Nehemiah Settle, NP  linaclotide [facility]) 145 MCG CAPS capsule Take 1 capsule (145 mcg total) by mouth daily before breakfast. 09/29/16  Yes Carlis Stable, NP  loratadine (CLARITIN) 10 MG tablet Take 10 mg by mouth daily as needed (for allergiese.).    Yes [provider]  triamcinolone cream (KENALOG) 0.1 % Apply 1 application topically 2 (two) times daily as needed. For eczema 08/14/16  Yes [provider]  peg 3350 powder (MOVIPREP) 100 g SOLR Take 1 kit (200 g total) by mouth as  directed. 10/26/16   Daneil Dolin, MD    Allergies as of 09/15/2016 - Review Complete 09/15/2016  Allergen Reaction Noted  . Motrin [ibuprofen] Palpitations 11/06/2012    Family History  Problem Relation Age of Onset  . Colon cancer Neg Hx   . Colon polyps Neg Hx     Social History   Social History  . Marital status: Single    Spouse name: N/A  . Number of children: N/A  . Years of education: N/A   Occupational History  . Not on file.   Social History Main Topics  . Smoking status: Current Every Day Smoker    Packs/day: 0.50    Years: 4.00    Types: Cigarettes  . Smokeless tobacco: Never Used  . Alcohol use Yes     Comment: occasional  . Drug use: No  . Sexual activity: Yes    Birth control/ protection: None   Other Topics Concern  . Not on file   Social History Narrative  . No narrative on file    Review of Systems: See HPI, otherwise negative ROS  Physical Exam: BP 115/74   Pulse 78   Temp 98 F (36.7 C) (Oral)   Resp 16   Ht 5' 3"  (1.6 m)   Wt 233 lb (105.7 kg)   SpO2 99%   BMI 41.27 kg/m  General:   Alert,  Well-developed, well-nourished, pleasant and cooperative in NAD Neck:  Supple; no masses or thyromegaly. No significant cervical adenopathy. Lungs:  Clear throughout  to auscultation.   No wheezes, crackles, or rhonchi. No acute distress. Heart:  Regular rate and rhythm; no murmurs, clicks, rubs,  or gallops. Abdomen: Non-distended, normal bowel sounds.  Soft and nontender without appreciable mass or hepatosplenomegaly.  Pulses:  Normal pulses noted. Extremities:  Without clubbing or edema.  Impression:  Rectal bleeding in the setting of constipation. Bowel function improvement with Linzess. Rectal bleeding needs further investigation per plan.  Recommendations: I have offered the patient a colonoscopy today.  The risks, benefits, limitations, alternatives and imponderables have been reviewed with the patient. Questions have been answered.  All parties are agreeable.      Notice: This dictation was prepared with Dragon dictation along with smaller phrase technology. Any transcriptional errors that result from this process are unintentional and may not be corrected upon review.

## 2016-10-29 NOTE — Discharge Instructions (Signed)
Colonoscopy Discharge Instructions  Read the instructions outlined below and refer to this sheet in the next few weeks. These discharge instructions provide you with general information on caring for yourself after you leave the hospital. Your doctor may also give you specific instructions. While your treatment has been planned according to the most current medical practices available, unavoidable complications occasionally occur. If you have any problems or questions after discharge, call Dr. Gala Romney at [phone]. ACTIVITY  You may resume your regular activity, but move at a slower pace for the next 24 hours.   Take frequent rest periods for the next 24 hours.   Walking will help get rid of the air and reduce the bloated feeling in your belly (abdomen).   No driving for 24 hours (because of the medicine (anesthesia) used during the test).    Do not sign any important legal documents or operate any machinery for 24 hours (because of the anesthesia used during the test).  NUTRITION  Drink plenty of fluids.   You may resume your normal diet as instructed by your doctor.   Begin with a light meal and progress to your normal diet. Heavy or fried foods are harder to digest and may make you feel sick to your stomach (nauseated).   Avoid alcoholic beverages for 24 hours or as instructed.  MEDICATIONS  You may resume your normal medications unless your doctor tells you otherwise.  WHAT YOU CAN EXPECT TODAY  Some feelings of bloating in the abdomen.   Passage of more gas than usual.   Spotting of blood in your stool or on the toilet paper.  IF YOU HAD POLYPS REMOVED DURING THE COLONOSCOPY:  No aspirin products for 7 days or as instructed.   No alcohol for 7 days or as instructed.   Eat a soft diet for the next 24 hours.  FINDING OUT THE RESULTS OF YOUR TEST Not all test results are available during your visit. If your test results are not back during the visit, make an appointment  with your caregiver to find out the results. Do not assume everything is normal if you have not heard from your caregiver or the medical facility. It is important for you to follow up on all of your test results.  SEEK IMMEDIATE MEDICAL ATTENTION IF:  You have more than a spotting of blood in your stool.   Your belly is swollen (abdominal distention).   You are nauseated or vomiting.   You have a temperature over 101.   You have abdominal pain or discomfort that is severe or gets worse throughout the day.    Polyp and constipation information provided  Decrease Linzess to 1 capsule every other day  No MRI in the future until clip noted to be gone.  Further recommendations to follow pending review of pathology report  Office visit with [place] in 4-6 weeks       Colon Polyps Polyps are tissue growths inside the body. Polyps can grow in many places, including the large intestine (colon). A polyp may be a round bump or a mushroom-shaped growth. You could have one polyp or several. Most colon polyps are noncancerous (benign). However, some colon polyps can become cancerous over time. What are the causes? The exact cause of colon polyps is not known. What increases the risk? This condition is more likely to develop in people who:  Have a family history of colon cancer or colon polyps.  Are older than 50 or older than 45  if they are African American.  Have inflammatory bowel disease, such as ulcerative colitis or Crohn disease.  Are overweight.  Smoke cigarettes.  Do not get enough exercise.  Drink too much alcohol.  Eat a diet that is: ? High in fat and red meat. ? Low in fiber.  Had childhood cancer that was treated with abdominal radiation.  What are the signs or symptoms? Most polyps do not cause symptoms. If you have symptoms, they may include:  Blood coming from your rectum when having a bowel movement.  Blood in your stool.The stool may look dark red or  black.  A change in bowel habits, such as constipation or diarrhea.  How is this diagnosed? This condition is diagnosed with a colonoscopy. This is a procedure that uses a lighted, flexible scope to look at the inside of your colon. How is this treated? Treatment for this condition involves removing any polyps that are found. Those polyps will then be tested for cancer. If cancer is found, your health care provider will talk to you about options for colon cancer treatment. Follow these instructions at home: Diet  Eat plenty of fiber, such as fruits, vegetables, and whole grains.  Eat foods that are high in calcium and vitamin D, such as milk, cheese, yogurt, eggs, liver, fish, and broccoli.  Limit foods high in fat, red meats, and processed meats, such as hot dogs, sausage, bacon, and lunch meats.  Maintain a healthy weight, or lose weight if recommended by your health care provider. General instructions  Do not smoke cigarettes.  Do not drink alcohol excessively.  Keep all follow-up visits as told by your health care provider. This is important. This includes keeping regularly scheduled colonoscopies. Talk to your health care provider about when you need a colonoscopy.  Exercise every day or as told by your health care provider. Contact a health care provider if:  You have new or worsening bleeding during a bowel movement.  You have new or increased blood in your stool.  You have a change in bowel habits.  You unexpectedly lose weight. This information is not intended to replace advice given to you by your health care provider. Make sure you discuss any questions you have with your health care provider. Document Released: 02/05/2004 Document Revised: 10/17/2015 Document Reviewed: 04/01/2015 Elsevier Interactive Patient Education  2018 [employer].    Constipation, Adult Constipation is when a person:  Poops (has a bowel movement) fewer times in a week than  normal.  Has a hard time pooping.  Has poop that is dry, hard, or bigger than normal.  Follow these instructions at home: Eating and drinking   Eat foods that have a lot of fiber, such as: ? Fresh fruits and vegetables. ? Whole grains. ? Beans.  Eat less of foods that are high in fat, low in fiber, or overly processed, such as: ? [place] fries. ? Hamburgers. ? Cookies. ? Candy. ? Soda.  Drink enough fluid to keep your pee (urine) clear or pale yellow. General instructions  Exercise regularly or as told by your doctor.  Go to the restroom when you feel like you need to poop. Do not hold it in.  Take over-the-counter and prescription medicines only as told by your doctor. These include any fiber supplements.  Do pelvic floor retraining exercises, such as: ? Doing deep breathing while relaxing your lower belly (abdomen). ? Relaxing your pelvic floor while pooping.  Watch your condition for any changes.  Keep all  follow-up visits as told by your doctor. This is important. Contact a doctor if:  You have pain that gets worse.  You have a fever.  You have not pooped for 4 days.  You throw up (vomit).  You are not hungry.  You lose weight.  You are bleeding from the anus.  You have thin, pencil-like poop (stool). Get help right away if:  You have a fever, and your symptoms suddenly get worse.  You leak poop or have blood in your poop.  Your belly feels hard or bigger than normal (is bloated).  You have very bad belly pain.  You feel dizzy or you faint. This information is not intended to replace advice given to you by your health care provider. Make sure you discuss any questions you have with your health care provider. Document Released: 10/28/2007 Document Revised: 11/29/2015 Document Reviewed: 10/30/2015 Elsevier Interactive Patient Education  2017 [employer].

## 2016-10-29 NOTE — Anesthesia Postprocedure Evaluation (Signed)
Anesthesia Post Note  Patient: Architect  Procedure(s) Performed: Procedure(s) (LRB): COLONOSCOPY WITH PROPOFOL (N/A) POLYPECTOMY  Patient location during evaluation: PACU Anesthesia Type: MAC Level of consciousness: awake and patient cooperative Pain management: pain level controlled Vital Signs Assessment: post-procedure vital signs reviewed and stable Respiratory status: spontaneous breathing, nonlabored ventilation and respiratory function stable Cardiovascular status: blood pressure returned to baseline Postop Assessment: adequate PO intake Anesthetic complications: no     Last Vitals:  Vitals:   10/29/16 1145 10/29/16 1230  BP: 115/74   Pulse:    Resp: 16   Temp:  37.1 C    Last Pain:  Vitals:   10/29/16 1118  TempSrc: Oral                 Noele Icenhour J

## 2016-10-29 NOTE — Anesthesia Preprocedure Evaluation (Signed)
Anesthesia Evaluation  Patient identified by MRN, date of birth, ID band Patient awake    Reviewed: Allergy & Precautions, NPO status , Patient's Chart, lab work & pertinent test results  Airway Mallampati: II       Dental  (+) Teeth Intact   Pulmonary asthma , Current Smoker,    breath sounds clear to auscultation       Cardiovascular negative cardio ROS   Rhythm:Regular Rate:Normal     Neuro/Psych negative neurological ROS     GI/Hepatic Neg liver ROS, GERD  Medicated,  Endo/Other  negative endocrine ROS  Renal/GU negative Renal ROS     Musculoskeletal   Abdominal   Peds  Hematology negative hematology ROS (+)   Anesthesia Other Findings   Reproductive/Obstetrics                             Anesthesia Physical Anesthesia Plan  ASA: II  Anesthesia Plan: MAC   Post-op Pain Management:    Induction: Intravenous  PONV Risk Score and Plan:   Airway Management Planned: Simple Face Mask  Additional Equipment:   Intra-op Plan:   Post-operative Plan:   Informed Consent: I have reviewed the patients History and Physical, chart, labs and discussed the procedure including the risks, benefits and alternatives for the proposed anesthesia with the patient or authorized representative who has indicated his/her understanding and acceptance.     Plan Discussed with:   Anesthesia Plan Comments:         Anesthesia Quick Evaluation

## 2016-10-29 NOTE — Op Note (Signed)
[facility] Patient Name: Sabrina Perez Procedure Date: 10/29/2016 11:44 AM MRN: [id number] Date of Birth: April 19, 1991 Attending MD: Norvel Richards , MD CSN: [id number] Age: 26 Admit Type: Outpatient Procedure:                Colonoscopy Indications:              Hematochezia Providers:                Norvel Richards, MD, Jeanann Lewandowsky. Sharon Seller, RN,                            Aram Candela Referring MD:              Medicines:                Propofol per Anesthesia Complications:            No immediate complications. Estimated Blood Loss:     Estimated blood loss: none. Procedure:                Pre-Anesthesia Assessment:                           - Prior to the procedure, a History and Physical                            was performed, and patient medications and                            allergies were reviewed. The patient's tolerance of                            previous anesthesia was also reviewed. The risks                            and benefits of the procedure and the sedation                            options and risks were discussed with the patient.                            All questions were answered, and informed consent                            was obtained. Prior Anticoagulants: The patient has                            taken no previous anticoagulant or antiplatelet                            agents. ASA Grade Assessment: II - A patient with                            mild systemic disease. After reviewing the risks  and benefits, the patient was deemed in                            satisfactory condition to undergo the procedure.                           After obtaining informed consent, the colonoscope                            was passed under direct vision. Throughout the                            procedure, the patient's blood pressure, pulse, and                            oxygen saturations were monitored  continuously. The                            EC-[id number] ([id number]) scope was introduced through                            the and advanced to the 5 cm into the ileum. The                            colonoscopy was performed without difficulty. The                            patient tolerated the procedure well. The quality                            of the bowel preparation was adequate. The entire                            colon was well visualized. The terminal ileum,                            ileocecal valve, appendiceal orifice, and rectum                            were photographed. The colonoscopy was performed                            without difficulty. The patient tolerated the                            procedure well. The quality of the bowel                            preparation was adequate. Scope In: 12:11:19 PM Scope Out: 12:26:15 PM Scope Withdrawal Time: 0 hours 11 minutes 34 seconds  Total Procedure Duration: 0 hours 14 minutes 56 seconds  Findings:      The perianal and digital rectal examinations were normal.      A 13 mm polyp was found in the proximal  sigmoid colon. The polyp was       pedunculated. The polyp was removed with a hot snare. Resection and       retrieval were complete. Estimated blood loss: none. polypectomy stalk       clipped x 1 prophylactically.      The exam was otherwise without abnormality on direct and retroflexion       views. The distal 5 cm of terminal ileal mucosa appeared normal. Impression:               The large polyp removed may have produced recent                            rectal bleeding in the setting of constipation.                           - One 13 mm polyp in the proximal sigmoid colon in                            the distal sigmoid colon, removed with a hot snare.                            Resected and retrieved.                           - The examination was otherwise normal on direct                            and  retroflexion views. Moderate Sedation:      Moderate (conscious) sedation was personally administered by an       anesthesia professional. The following parameters were monitored: oxygen       saturation, heart rate, blood pressure, respiratory rate, EKG, adequacy       of pulmonary ventilation, and response to care. Total physician       intraservice time was 28 minutes. Recommendation:           - Patient has a contact number available for                            emergencies. The signs and symptoms of potential                            delayed complications were discussed with the                            patient. Return to normal activities tomorrow.                            Written discharge instructions were provided to the                            patient.                           - Resume previous diet.                           -  Continue present medications. Decrease Linzess to                            1capsule every other day                           - Repeat colonoscopy date to be determined after                            pending pathology results are reviewed for                            surveillance.                           - Return to GI office in 4 weeks. No FUTURE MRI                            until clip known to be gone. 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 All rights reserved. The codes documented in this report are preliminary and upon coder review may  be revised to meet current compliance requirements. Cristopher Estimable. Deja Pisarski, MD Norvel Richards, MD 10/29/2016 12:38:03 PM This report has been signed electronically. Number of Addenda: 0

## 2016-10-31 ENCOUNTER — Encounter: Payer: Self-pay | Admitting: Internal Medicine

## 2016-11-03 ENCOUNTER — Telehealth: Payer: Self-pay

## 2016-11-03 NOTE — Telephone Encounter (Signed)
OV made °

## 2016-11-03 NOTE — Telephone Encounter (Signed)
Per RMR-  Rourk, Cristopher Estimable, MD  Claudina Lick, LPN        Send letter to patient.  Send copy of letter with path to referring provider and PCP.   Ov in 6 weeks to re-assess bleeding

## 2016-11-03 NOTE — Telephone Encounter (Signed)
Letter mailed to the pt. 

## 2016-11-04 ENCOUNTER — Encounter (HOSPITAL_COMMUNITY): Payer: Self-pay | Admitting: Internal Medicine

## 2016-11-06 NOTE — Telephone Encounter (Signed)
I informed the patient of her results from result letter

## 2016-12-08 ENCOUNTER — Other Ambulatory Visit: Payer: Self-pay | Admitting: Nurse Practitioner

## 2016-12-08 DIAGNOSIS — K625 Hemorrhage of anus and rectum: Secondary | ICD-10-CM

## 2016-12-08 DIAGNOSIS — K5900 Constipation, unspecified: Secondary | ICD-10-CM

## 2016-12-08 DIAGNOSIS — R195 Other fecal abnormalities: Secondary | ICD-10-CM

## 2016-12-15 ENCOUNTER — Encounter: Payer: Self-pay | Admitting: Nurse Practitioner

## 2016-12-15 ENCOUNTER — Ambulatory Visit (INDEPENDENT_AMBULATORY_CARE_PROVIDER_SITE_OTHER): Payer: Medicaid Other | Admitting: Nurse Practitioner

## 2016-12-15 VITALS — BP 131/79 | HR 82 | Temp 97.8°F | Ht 62.0 in | Wt 238.8 lb

## 2016-12-15 DIAGNOSIS — K625 Hemorrhage of anus and rectum: Secondary | ICD-10-CM | POA: Diagnosis not present

## 2016-12-15 DIAGNOSIS — K5900 Constipation, unspecified: Secondary | ICD-10-CM

## 2016-12-15 MED ORDER — LINACLOTIDE 72 MCG PO CAPS: 72.0000 ug | ORAL_CAPSULE | Freq: Every day | ORAL | 2 refills | Status: DC

## 2016-12-15 MED ORDER — LINACLOTIDE 72 MCG PO CAPS: 72.0000 ug | ORAL_CAPSULE | Freq: Every day | ORAL | 0 refills | Status: DC

## 2016-12-15 NOTE — Assessment & Plan Note (Signed)
Constipation was much improved after colonoscopy. However, on the 14th of this month she began having worsening constipation and associated abdominal pain. She went approximately 9 days before having a bowel movement. She notes that she had run out of Linzess at that time. She was previously on 145 g dosing and this is causing diarrhea and she had attempted to switch to every other day dosing based on recommendations made after her colonoscopy. She did finally have a bowel movement which was associated with significant rectal bleeding. Likely due to constipation and straining. I will trial her on Linzess 72 g once a day. She will call in 1-2 weeks and let us know if it is helping. If not, we can try other options including Amitiza and Trulance. Return for follow-up in 3 months. Notify us of any recurrent or worsening symptoms.

## 2016-12-15 NOTE — Progress Notes (Signed)
cc'ed to pcp °

## 2016-12-15 NOTE — Addendum Note (Signed)
Addended by: Gordy Levan, ERIC A on: 12/15/2016 10:31 AM   Modules accepted: Orders

## 2016-12-15 NOTE — Progress Notes (Signed)
Referring Provider: Raiford Simmonds., PA-C Primary Care Physician:  Raiford Simmonds., PA-C Primary GI:  Dr. Gala Romney  Chief Complaint  Patient presents with  . Constipation    HPI:   Sabrina Perez is a 26 y.o. female who presents For constipation and rectal bleeding follow-up. The patient was last seen in our office 09/15/2016 for the same as well as abdominal pain. At that time she described to years of rectal bleeding with the majority of her bowel movements and noted heme positive stool with primary care. At her last visit she was doing well overall but admitted 2-3 years of rectal bleeding since her first pregnancy which is been attributed to likely hemorrhoids. [facility] exam [facility] the [facility] was heme positive but negative for hemorrhoids. Occasionally has bowel movements where she only passes clots. Rare lower abdominal pain and at that time she had been having a bowel movement once a week. Abdominal pain improved after bowel movement. No other GI symptoms. She did have a significant episode of essentially obstipation a few weeks prior requiring laxatives, water, enema. She was given Linzess 145 g with 2 weeks of samples and requested a progress report. She was recommended for colonoscopy.  She called to inform us Linzess is working but was still having rectal bleeding. Labs are ordered for CBC which showed normal hemoglobin of 14.3.  Colonoscopy completed 10/29/2016 which found a large polyp possibly responsible for recent bleeding in the setting of constipation. It was a 13 mm polyp in the proximal sigmoid colon which was removed. 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 Recommended decreased Linzess to every other day. Recommended repeat colonoscopy in 3 years.  Today she states she's doing ok overall. Constipation was doing well after colonoscopy. Then on 7/14 began having more constipation and didn't have a  bowel movement until 7/23. She had significant bleeding after her bowel movement. Ran out of Linzess. Was having diarrhea on this, even at every other day dosing. She was on 145 mcg dosing. Besides the episode yesterday, no recurrent rectal bleeding. Has lower abdominal pain associated with constipation but this improved after her bowel movement. She is not on any pain medications. Denies other abdominal pain, melena, fever, chills, unintentional weight loss. Denies chest pain, dyspnea, dizziness, lightheadedness, syncope, near syncope. Denies any other upper or lower GI symptoms.   Past Medical History:  Diagnosis Date  . Asthma   . Environmental and seasonal allergies   . GERD (gastroesophageal reflux disease)     Past Surgical History:  Procedure Laterality Date  . COLONOSCOPY WITH PROPOFOL N/A 10/29/2016   Procedure: COLONOSCOPY WITH PROPOFOL;  Surgeon: Daneil Dolin, MD;  Location: [facility];  Service: Endoscopy;  Laterality: N/A;  1230   . POLYPECTOMY  10/29/2016   Procedure: POLYPECTOMY;  Surgeon: Daneil Dolin, MD;  Location: [facility];  Service: Endoscopy;;  colon  . WISDOM TOOTH EXTRACTION      Current Outpatient Prescriptions  Medication Sig Dispense Refill  . albuterol (PROVENTIL HFA;VENTOLIN HFA) 108 (90 BASE) MCG/ACT inhaler Inhale 1-2 puffs into the lungs every 6 (six) hours as needed for wheezing or shortness of breath. 1 Inhaler 0  . beclomethasone (QVAR) 40 MCG/ACT inhaler Inhale 1 puff into the lungs 2 (two) times daily.    . calcium carbonate (TUMS - DOSED IN MG ELEMENTAL CALCIUM) 500 MG chewable tablet Chew 1-2 tablets by mouth 4 (four) times daily as needed  for indigestion or heartburn.     . fluticasone (FLONASE) 50 MCG/ACT nasal spray Place 2 sprays into both nostrils daily. (Patient taking differently: Place 2 sprays into both nostrils daily as needed (for allergies.). ) 16 g 2  . LINZESS 145 MCG CAPS capsule TAKE ONE CAPSULE BY MOUTH EVERY DAY BEFORE  breakfast 30 capsule 3  . loratadine (CLARITIN) 10 MG tablet Take 10 mg by mouth daily as needed (for allergiese.).     Marland Kitchen triamcinolone cream (KENALOG) 0.1 % Apply 1 application topically 2 (two) times daily as needed. For eczema  2   No current facility-administered medications for this visit.     Allergies as of 12/15/2016 - Review Complete 12/15/2016  Allergen Reaction Noted  . Motrin [ibuprofen] Palpitations 11/06/2012    Family History  Problem Relation Age of Onset  . Colon cancer Neg Hx   . Colon polyps Neg Hx     Social History   Social History  . Marital status: Single    Spouse name: N/A  . Number of children: N/A  . Years of education: N/A   Social History Main Topics  . Smoking status: Current Every Day Smoker    Packs/day: 0.50    Years: 4.00    Types: Cigarettes  . Smokeless tobacco: Never Used  . Alcohol use Yes     Comment: occasional  . Drug use: No  . Sexual activity: Yes    Birth control/ protection: None   Other Topics Concern  . Not on file   Social History Narrative  . No narrative on file    Review of Systems: General: Negative for anorexia, weight loss, fever, chills, fatigue, weakness. ENT: Negative for hoarseness, difficulty swallowing. CV: Negative for chest pain, angina, palpitations, peripheral edema.  Respiratory: Negative for dyspnea at rest, dyspnea on exertion, cough, sputum, wheezing.  GI: See history of present illness. Endo: Negative for unusual weight change.  Heme: Rectal bleeding x 1 as per HPI.   Physical Exam: BP 131/79   Pulse 82   Temp 97.8 F (36.6 C) (Oral)   Ht 5\' 2"  (1.575 m)   Wt 238 lb 12.8 oz (108.3 kg)   BMI 43.68 kg/m  General:   Morbidly obese female. Alert and oriented. Pleasant and cooperative. Well-nourished and well-developed.  Eyes:  Without icterus, sclera clear and conjunctiva pink.  Ears:  Normal auditory acuity. Cardiovascular:  S1, S2 present without murmurs appreciated. Extremities  without clubbing or edema. Respiratory:  Clear to auscultation bilaterally. No wheezes, rales, or rhonchi. No distress.  Gastrointestinal:  +BS, obese but soft, non-tender and non-distended. No HSM noted. No guarding or rebound. Rectal:  Deferred  Musculoskalatal:  Symmetrical without gross deformities. Neurologic:  Alert and oriented x4;  grossly normal neurologically. Psych:  Alert and cooperative. Normal mood and affect. Heme/Lymph/Immune: No excessive bruising noted.    12/15/2016 10:10 AM   Disclaimer: This note was dictated with voice recognition software. Similar sounding words can inadvertently be transcribed and may not be corrected upon review.

## 2016-12-15 NOTE — Patient Instructions (Signed)
1. I will provide you with samples of Linzess 72 g, the lowest dose. 2. Call us in 1-2 weeks and let us know how it is working. 3. We will make changes as needed over the phone to try to find the best constipation medicine for you. 4. Return for follow-up in 3 months. 5. Call us if you have any worsening symptoms, questions, or concerns.

## 2016-12-15 NOTE — Assessment & Plan Note (Signed)
Rectal bleeding has improved. She has only had 1 episode which is associated with a one-week to 9 day course of significant constipation after she ran out of [place]. Recommend continue to monitor. We will attempt to better control her constipation and see if this helps her subsequent rectal bleeding. Return for follow-up in 3 months. We will cycle through constipation medications to try to find the option that works best for her. She will call with any worsening symptoms or recurrent symptoms.

## 2017-01-05 NOTE — Progress Notes (Signed)
cancel
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
## 2017-02-24 ENCOUNTER — Encounter (INDEPENDENT_AMBULATORY_CARE_PROVIDER_SITE_OTHER): Payer: Self-pay

## 2017-02-24 ENCOUNTER — Encounter (HOSPITAL_COMMUNITY): Payer: Self-pay | Admitting: Psychiatry

## 2017-02-24 ENCOUNTER — Ambulatory Visit (INDEPENDENT_AMBULATORY_CARE_PROVIDER_SITE_OTHER): Payer: Medicaid Other | Admitting: Psychiatry

## 2017-02-24 VITALS — BP 128/83 | HR 85 | Ht 62.0 in | Wt 248.0 lb

## 2017-02-24 DIAGNOSIS — F4310 Post-traumatic stress disorder, unspecified: Secondary | ICD-10-CM | POA: Diagnosis not present

## 2017-02-24 DIAGNOSIS — G4700 Insomnia, unspecified: Secondary | ICD-10-CM | POA: Diagnosis not present

## 2017-02-24 DIAGNOSIS — Z79899 Other long term (current) drug therapy: Secondary | ICD-10-CM

## 2017-02-24 DIAGNOSIS — J45909 Unspecified asthma, uncomplicated: Secondary | ICD-10-CM | POA: Diagnosis not present

## 2017-02-24 DIAGNOSIS — F17210 Nicotine dependence, cigarettes, uncomplicated: Secondary | ICD-10-CM

## 2017-02-24 DIAGNOSIS — Z62810 Personal history of physical and sexual abuse in childhood: Secondary | ICD-10-CM

## 2017-02-24 DIAGNOSIS — F329 Major depressive disorder, single episode, unspecified: Secondary | ICD-10-CM

## 2017-02-24 DIAGNOSIS — F1290 Cannabis use, unspecified, uncomplicated: Secondary | ICD-10-CM

## 2017-02-24 DIAGNOSIS — K219 Gastro-esophageal reflux disease without esophagitis: Secondary | ICD-10-CM | POA: Diagnosis not present

## 2017-02-24 DIAGNOSIS — F1021 Alcohol dependence, in remission: Secondary | ICD-10-CM | POA: Diagnosis not present

## 2017-02-24 MED ORDER — TRAZODONE HCL 150 MG PO TABS: 150.0000 mg | ORAL_TABLET | Freq: Every evening | ORAL | 1 refills | Status: DC | PRN

## 2017-02-24 MED ORDER — SERTRALINE HCL 50 MG PO TABS: ORAL_TABLET | ORAL | 1 refills | Status: DC

## 2017-02-24 NOTE — Patient Instructions (Signed)
1. Start sertraline 25 mg daily for two weeks, then 50 mg daily 2. Start Trazodone 150 mg at night as needed for sleep 3. Return to clinic in one month for 30 mins 4. Referral to therapy (with Ms. Peggy Bynum)

## 2017-02-24 NOTE — Addendum Note (Signed)
Addended by: Norman Clay on: 02/24/2017 10:38 AM   Modules accepted: Level of Service

## 2017-03-17 ENCOUNTER — Encounter: Payer: Self-pay | Admitting: Nurse Practitioner

## 2017-03-17 ENCOUNTER — Ambulatory Visit (INDEPENDENT_AMBULATORY_CARE_PROVIDER_SITE_OTHER): Payer: Medicaid Other | Admitting: Nurse Practitioner

## 2017-03-17 ENCOUNTER — Ambulatory Visit (INDEPENDENT_AMBULATORY_CARE_PROVIDER_SITE_OTHER): Payer: Medicaid Other | Admitting: Psychiatry

## 2017-03-17 ENCOUNTER — Encounter (HOSPITAL_COMMUNITY): Payer: Self-pay | Admitting: Psychiatry

## 2017-03-17 VITALS — BP 132/74 | HR 75 | Temp 98.2°F | Ht 63.0 in | Wt 252.0 lb

## 2017-03-17 DIAGNOSIS — K5900 Constipation, unspecified: Secondary | ICD-10-CM | POA: Diagnosis not present

## 2017-03-17 DIAGNOSIS — K625 Hemorrhage of anus and rectum: Secondary | ICD-10-CM

## 2017-03-17 DIAGNOSIS — F4310 Post-traumatic stress disorder, unspecified: Secondary | ICD-10-CM

## 2017-03-17 NOTE — Progress Notes (Signed)
Referring Provider: Raiford Simmonds., PA-C Primary Care Physician:  Raiford Simmonds., PA-C Primary GI:  Dr. Gala Romney  Chief Complaint  Patient presents with  . Follow-up    constipation/rectal bleeding-has improved    HPI:   Sabrina Perez is a 26 y.o. female who presents for follow-up on rectal bleeding and constipation. She was last seen in our office 12/15/2016 for constipation. Colonoscopy up-to-date 2018 with recommended 3 year repeat. At her last visit she was doing well after colonoscopy related to her constipation. She began having more constipation the week prior to her visit with noted significant bleeding because she ran out of her Linzess. Recommended Linzess 72 g, return for follow-up in 3 months.  Today she states she's doing well overall. Has only had 1 incident of rectal bleeding when she stopped Linzess thinking she didn't need it. However, has restarted and now that she's taking it daily is not having any more constipation or rectal bleeding. Denies abdominal pain, N/V, hematochezia, melena. Has a bowel movement about twice a day, consistent with [place] 4, does feel she empties completely. Denies chest pain, dyspnea, dizziness, lightheadedness, syncope, near syncope. Denies any other upper or lower GI symptoms.  Past Medical History:  Diagnosis Date  . Asthma   . Environmental and seasonal allergies   . GERD (gastroesophageal reflux disease)     Past Surgical History:  Procedure Laterality Date  . COLONOSCOPY WITH PROPOFOL N/A 10/29/2016   Procedure: COLONOSCOPY WITH PROPOFOL;  Surgeon: Daneil Dolin, MD;  [facility];  Service: Endoscopy;  Laterality: N/A;  1230   . POLYPECTOMY  10/29/2016   Procedure: POLYPECTOMY;  Surgeon: Daneil Dolin, MD;  [facility]: [facility];  Service: Endoscopy;;  colon  . WISDOM TOOTH EXTRACTION      Current Outpatient Prescriptions  Medication Sig Dispense Refill  . albuterol (PROVENTIL HFA;VENTOLIN HFA) 108 (90  BASE) MCG/ACT inhaler Inhale 1-2 puffs into the lungs every 6 (six) hours as needed for wheezing or shortness of breath. 1 Inhaler 0  . beclomethasone (QVAR) 40 MCG/ACT inhaler Inhale 1 puff into the lungs 2 (two) times daily.    . calcium carbonate (TUMS - DOSED IN MG ELEMENTAL CALCIUM) 500 MG chewable tablet Chew 1-2 tablets by mouth 4 (four) times daily as needed for indigestion or heartburn.     . cephALEXin (KEFLEX) 500 MG capsule Take 500 mg by mouth as needed. For hydrentis breakout    . fluticasone (FLONASE) 50 MCG/ACT nasal spray Place 2 sprays into both nostrils daily. (Patient taking differently: Place 2 sprays into both nostrils daily as needed (for allergies.). ) 16 g 2  . linaclotide (LINZESS) 72 MCG capsule Take 1 capsule (72 mcg total) by mouth daily before breakfast. 30 capsule 2  . loratadine (CLARITIN) 10 MG tablet Take 10 mg by mouth daily as needed (for allergiese.).     Marland Kitchen sertraline (ZOLOFT) 50 MG tablet Start 25 mg daily for two weeks, then 50 mg daily 30 tablet 1  . traZODone (DESYREL) 150 MG tablet Take 1 tablet (150 mg total) by mouth at bedtime as needed for sleep. 30 tablet 1  . triamcinolone cream (KENALOG) 0.1 % Apply 1 application topically 2 (two) times daily as needed. For eczema  2   No current facility-administered medications for this visit.     Allergies as of 03/17/2017 - Review Complete 03/17/2017  Allergen Reaction Noted  . Motrin [ibuprofen] Palpitations 11/06/2012    Family History  Problem Relation Age of Onset  . Anxiety disorder Mother   . Drug abuse Father   . Alcohol abuse Maternal Uncle   . Alcohol abuse Paternal Uncle   . Drug abuse Paternal Uncle   . Colon cancer Neg Hx   . Colon polyps Neg Hx     Social History   Social History  . Marital status: Single    Spouse name: N/A  . Number of children: N/A  . Years of education: N/A   Social History Main Topics  . Smoking status: Current Every Day Smoker    Packs/day: 1.00    Years:  4.00    Types: Cigarettes  . Smokeless tobacco: Never Used  . Alcohol use Yes     Comment: occasional, 02-24-2017 per pt every night  . Drug use: No     Comment: 02-24-2017 per pt stopped Marijuana about 1 mth ago  . Sexual activity: Yes    Birth control/ protection: None   Other Topics Concern  . None   Social History Narrative  . None    Review of Systems: General: Negative for anorexia, weight loss, fever, chills, fatigue, weakness. ENT: Negative for hoarseness, difficulty swallowing. CV: Negative for chest pain, angina, palpitations, peripheral edema.  Respiratory: Negative for dyspnea at rest, cough, sputum, wheezing.  GI: See history of present illness. Endo: Negative for unusual weight change.  Heme: Negative for bruising or bleeding. Allergy: Negative for rash or hives.   Physical Exam: BP 132/74   Pulse 75   Temp 98.2 F (36.8 C) (Oral)   Ht 5\' 3"  (1.6 m)   Wt 252 lb (114.3 kg)   LMP 05/25/2016 (Approximate)   BMI 44.64 kg/m  General:   Morbidly obese female. Alert and oriented. Pleasant and cooperative. Well-nourished and well-developed.  Eyes:  Without icterus, sclera clear and conjunctiva pink.  Ears:  Normal auditory acuity. Cardiovascular:  S1, S2 present without murmurs appreciated. Extremities without clubbing or edema. Respiratory:  Clear to auscultation bilaterally. No wheezes, rales, or rhonchi. No distress.  Gastrointestinal:  +BS, obese but soft, non-tender and non-distended. No HSM noted. No guarding or rebound. No masses appreciated.  Rectal:  Deferred  Musculoskalatal:  Symmetrical without gross deformities. Skin:  Intact without significant lesions or rashes. Neurologic:  Alert and oriented x4;  grossly normal neurologically. Psych:  Alert and cooperative. Normal mood and affect. Heme/Lymph/Immune: No excessive bruising noted.    03/17/2017 9:57 AM   Disclaimer: This note was dictated with voice recognition software. Similar sounding  words can inadvertently be transcribed and may not be corrected upon review.

## 2017-03-17 NOTE — Patient Instructions (Signed)
1. Continue taking your Linzess 72 g daily. 2. Return for follow-up in 6 months. 3. Call us if you have any questions or concerns.

## 2017-03-17 NOTE — Progress Notes (Signed)
cc'ed to pcp °

## 2017-03-17 NOTE — Assessment & Plan Note (Signed)
No further rectal bleeding since improvement in constipation. Continue to monitor. Return for follow-up in 6 months.

## 2017-03-17 NOTE — Assessment & Plan Note (Signed)
Constipation significantly improved. Recommend she continue Linzess 72 g daily. Return for follow-up in 6 months. She can call with any recurrent or worsening symptoms before then.
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
## 2017-03-26 ENCOUNTER — Ambulatory Visit (INDEPENDENT_AMBULATORY_CARE_PROVIDER_SITE_OTHER): Payer: Medicaid Other | Admitting: Psychiatry

## 2017-03-26 ENCOUNTER — Encounter (HOSPITAL_COMMUNITY): Payer: Self-pay | Admitting: Psychiatry

## 2017-03-26 VITALS — BP 130/77 | HR 77 | Ht 63.0 in | Wt 252.0 lb

## 2017-03-26 DIAGNOSIS — Z813 Family history of other psychoactive substance abuse and dependence: Secondary | ICD-10-CM

## 2017-03-26 DIAGNOSIS — R450 Nervousness: Secondary | ICD-10-CM

## 2017-03-26 DIAGNOSIS — G4700 Insomnia, unspecified: Secondary | ICD-10-CM

## 2017-03-26 DIAGNOSIS — F1290 Cannabis use, unspecified, uncomplicated: Secondary | ICD-10-CM | POA: Diagnosis not present

## 2017-03-26 DIAGNOSIS — J45909 Unspecified asthma, uncomplicated: Secondary | ICD-10-CM | POA: Diagnosis not present

## 2017-03-26 DIAGNOSIS — F1111 Opioid abuse, in remission: Secondary | ICD-10-CM

## 2017-03-26 DIAGNOSIS — F419 Anxiety disorder, unspecified: Secondary | ICD-10-CM | POA: Diagnosis not present

## 2017-03-26 DIAGNOSIS — Z818 Family history of other mental and behavioral disorders: Secondary | ICD-10-CM | POA: Diagnosis not present

## 2017-03-26 DIAGNOSIS — F4310 Post-traumatic stress disorder, unspecified: Secondary | ICD-10-CM

## 2017-03-26 DIAGNOSIS — Z811 Family history of alcohol abuse and dependence: Secondary | ICD-10-CM

## 2017-03-26 DIAGNOSIS — K219 Gastro-esophageal reflux disease without esophagitis: Secondary | ICD-10-CM

## 2017-03-26 DIAGNOSIS — F17210 Nicotine dependence, cigarettes, uncomplicated: Secondary | ICD-10-CM | POA: Diagnosis not present

## 2017-03-26 MED ORDER — SERTRALINE HCL 100 MG PO TABS: 100.0000 mg | ORAL_TABLET | Freq: Every day | ORAL | 0 refills | Status: DC

## 2017-03-26 MED ORDER — QUETIAPINE FUMARATE 25 MG PO TABS: 25.0000 mg | ORAL_TABLET | Freq: Every day | ORAL | 0 refills | Status: DC

## 2017-03-26 NOTE — Patient Instructions (Signed)
1. Increase sertraline 100 mg daily  2. Discontinue Trazodone 3. Start quetapine 25 mg at night 4. Return to clinic in one month for 30 mins

## 2017-04-02 ENCOUNTER — Encounter (HOSPITAL_COMMUNITY): Payer: Self-pay | Admitting: Psychiatry

## 2017-04-02 ENCOUNTER — Ambulatory Visit (INDEPENDENT_AMBULATORY_CARE_PROVIDER_SITE_OTHER): Payer: Medicaid Other | Admitting: Psychiatry

## 2017-04-02 DIAGNOSIS — F4310 Post-traumatic stress disorder, unspecified: Secondary | ICD-10-CM | POA: Diagnosis not present
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
## 2017-04-13 ENCOUNTER — Encounter (HOSPITAL_COMMUNITY): Payer: Self-pay | Admitting: Psychiatry

## 2017-04-13 ENCOUNTER — Ambulatory Visit (INDEPENDENT_AMBULATORY_CARE_PROVIDER_SITE_OTHER): Payer: Medicaid Other | Admitting: Psychiatry

## 2017-04-13 DIAGNOSIS — F4310 Post-traumatic stress disorder, unspecified: Secondary | ICD-10-CM | POA: Diagnosis not present
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
## 2017-04-28 ENCOUNTER — Encounter (HOSPITAL_COMMUNITY): Payer: Self-pay | Admitting: Psychiatry

## 2017-04-28 ENCOUNTER — Ambulatory Visit (INDEPENDENT_AMBULATORY_CARE_PROVIDER_SITE_OTHER): Payer: Medicaid Other | Admitting: Psychiatry

## 2017-04-28 VITALS — BP 132/76 | HR 114 | Ht 63.0 in | Wt 257.0 lb

## 2017-04-28 DIAGNOSIS — F17210 Nicotine dependence, cigarettes, uncomplicated: Secondary | ICD-10-CM | POA: Diagnosis not present

## 2017-04-28 DIAGNOSIS — R450 Nervousness: Secondary | ICD-10-CM | POA: Diagnosis not present

## 2017-04-28 DIAGNOSIS — F4310 Post-traumatic stress disorder, unspecified: Secondary | ICD-10-CM | POA: Diagnosis not present

## 2017-04-28 DIAGNOSIS — F419 Anxiety disorder, unspecified: Secondary | ICD-10-CM | POA: Diagnosis not present

## 2017-04-28 DIAGNOSIS — F1011 Alcohol abuse, in remission: Secondary | ICD-10-CM | POA: Diagnosis not present

## 2017-04-28 DIAGNOSIS — Z811 Family history of alcohol abuse and dependence: Secondary | ICD-10-CM | POA: Diagnosis not present

## 2017-04-28 DIAGNOSIS — Z818 Family history of other mental and behavioral disorders: Secondary | ICD-10-CM | POA: Diagnosis not present

## 2017-04-28 DIAGNOSIS — Z813 Family history of other psychoactive substance abuse and dependence: Secondary | ICD-10-CM | POA: Diagnosis not present

## 2017-04-28 MED ORDER — QUETIAPINE FUMARATE 25 MG PO TABS: 25.0000 mg | ORAL_TABLET | Freq: Every day | ORAL | 0 refills | Status: DC

## 2017-04-28 MED ORDER — SERTRALINE HCL 100 MG PO TABS: 150.0000 mg | ORAL_TABLET | Freq: Every day | ORAL | 0 refills | Status: DC

## 2017-04-28 NOTE — Patient Instructions (Signed)
1. Increase sertraline 150 mg daily  2. Continue quetiapine 25 mg at night 3. Return to clinic in one month for 30 mins

## 2017-04-30 ENCOUNTER — Encounter (HOSPITAL_COMMUNITY): Payer: Self-pay | Admitting: Psychiatry

## 2017-04-30 ENCOUNTER — Ambulatory Visit (INDEPENDENT_AMBULATORY_CARE_PROVIDER_SITE_OTHER): Payer: Medicaid Other | Admitting: Psychiatry

## 2017-04-30 DIAGNOSIS — F4310 Post-traumatic stress disorder, unspecified: Secondary | ICD-10-CM | POA: Diagnosis not present
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
## 2017-05-14 ENCOUNTER — Ambulatory Visit (INDEPENDENT_AMBULATORY_CARE_PROVIDER_SITE_OTHER): Payer: Medicaid Other | Admitting: Psychiatry

## 2017-05-14 ENCOUNTER — Encounter (HOSPITAL_COMMUNITY): Payer: Self-pay | Admitting: Psychiatry

## 2017-05-14 DIAGNOSIS — F4310 Post-traumatic stress disorder, unspecified: Secondary | ICD-10-CM
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
## 2017-05-27 ENCOUNTER — Ambulatory Visit (INDEPENDENT_AMBULATORY_CARE_PROVIDER_SITE_OTHER): Payer: Medicaid Other | Admitting: Psychiatry

## 2017-05-27 ENCOUNTER — Encounter (HOSPITAL_COMMUNITY): Payer: Self-pay | Admitting: Psychiatry

## 2017-05-27 VITALS — BP 110/77 | HR 90 | Ht 63.0 in | Wt 256.0 lb

## 2017-05-27 DIAGNOSIS — Z62810 Personal history of physical and sexual abuse in childhood: Secondary | ICD-10-CM | POA: Diagnosis not present

## 2017-05-27 DIAGNOSIS — G4700 Insomnia, unspecified: Secondary | ICD-10-CM | POA: Diagnosis not present

## 2017-05-27 DIAGNOSIS — F4310 Post-traumatic stress disorder, unspecified: Secondary | ICD-10-CM | POA: Diagnosis not present

## 2017-05-27 DIAGNOSIS — Z811 Family history of alcohol abuse and dependence: Secondary | ICD-10-CM

## 2017-05-27 DIAGNOSIS — Z813 Family history of other psychoactive substance abuse and dependence: Secondary | ICD-10-CM | POA: Diagnosis not present

## 2017-05-27 DIAGNOSIS — F419 Anxiety disorder, unspecified: Secondary | ICD-10-CM | POA: Diagnosis not present

## 2017-05-27 DIAGNOSIS — Z818 Family history of other mental and behavioral disorders: Secondary | ICD-10-CM | POA: Diagnosis not present

## 2017-05-27 DIAGNOSIS — R450 Nervousness: Secondary | ICD-10-CM | POA: Diagnosis not present

## 2017-05-27 DIAGNOSIS — F17210 Nicotine dependence, cigarettes, uncomplicated: Secondary | ICD-10-CM | POA: Diagnosis not present

## 2017-05-27 MED ORDER — SERTRALINE HCL 100 MG PO TABS
200.0000 mg | ORAL_TABLET | Freq: Every day | ORAL | 1 refills | Status: DC
Start: 2017-05-27 — End: 2017-09-15

## 2017-05-27 NOTE — Patient Instructions (Signed)
1. Increase sertraline 200 mg daily  2. Continue quetiapine 25 mg at night  3. Return to clinic in two months for 30 mins

## 2017-05-28 ENCOUNTER — Encounter (HOSPITAL_COMMUNITY): Payer: Self-pay | Admitting: Psychiatry

## 2017-05-28 ENCOUNTER — Ambulatory Visit (INDEPENDENT_AMBULATORY_CARE_PROVIDER_SITE_OTHER): Payer: Medicaid Other | Admitting: Psychiatry

## 2017-05-28 DIAGNOSIS — F4310 Post-traumatic stress disorder, unspecified: Secondary | ICD-10-CM
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
## 2017-06-07 ENCOUNTER — Other Ambulatory Visit (HOSPITAL_COMMUNITY): Payer: Self-pay | Admitting: Psychiatry

## 2017-06-07 MED ORDER — QUETIAPINE FUMARATE 25 MG PO TABS: 25.0000 mg | ORAL_TABLET | Freq: Every day | ORAL | 1 refills | Status: DC

## 2017-06-08 ENCOUNTER — Ambulatory Visit (HOSPITAL_COMMUNITY): Payer: Self-pay | Admitting: Psychiatry

## 2017-06-11 ENCOUNTER — Ambulatory Visit (HOSPITAL_COMMUNITY): Payer: Self-pay | Admitting: Psychiatry

## 2017-06-25 ENCOUNTER — Encounter (HOSPITAL_COMMUNITY): Payer: Self-pay | Admitting: Psychiatry

## 2017-06-25 ENCOUNTER — Ambulatory Visit (INDEPENDENT_AMBULATORY_CARE_PROVIDER_SITE_OTHER): Payer: Medicaid Other | Admitting: Psychiatry

## 2017-06-25 DIAGNOSIS — Z62810 Personal history of physical and sexual abuse in childhood: Secondary | ICD-10-CM | POA: Diagnosis not present

## 2017-06-25 DIAGNOSIS — F4310 Post-traumatic stress disorder, unspecified: Secondary | ICD-10-CM
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
## 2017-07-26 ENCOUNTER — Ambulatory Visit (HOSPITAL_COMMUNITY): Payer: Self-pay | Admitting: Psychiatry

## 2017-09-15 ENCOUNTER — Ambulatory Visit: Payer: Medicaid Other | Admitting: Nurse Practitioner

## 2017-09-15 ENCOUNTER — Encounter: Payer: Self-pay | Admitting: Nurse Practitioner

## 2017-09-15 VITALS — BP 146/90 | HR 91 | Temp 96.9°F | Ht 62.75 in | Wt 259.2 lb

## 2017-09-15 DIAGNOSIS — R1030 Lower abdominal pain, unspecified: Secondary | ICD-10-CM

## 2017-09-15 DIAGNOSIS — K625 Hemorrhage of anus and rectum: Secondary | ICD-10-CM

## 2017-09-15 DIAGNOSIS — K5900 Constipation, unspecified: Secondary | ICD-10-CM

## 2017-09-15 DIAGNOSIS — R109 Unspecified abdominal pain: Secondary | ICD-10-CM | POA: Insufficient documentation

## 2017-09-15 NOTE — Progress Notes (Signed)
cc'd to pcp 

## 2017-09-15 NOTE — Assessment & Plan Note (Signed)
The patient admits lower abdominal/pelvic pain.  This pain typically resolves after bowel movement.  It also sometimes resolves after urinating.  She thought she had a UTI but urine tested normal.  Additionally she denies she has dysuria and hematuria given the further information from her exam and review of systems as well as HPI it seems her constipation may be controlled but not adequately enough.  We will try to further manage her constipation as per above.  Follow-up in 3 months.

## 2017-09-15 NOTE — Assessment & Plan Note (Signed)
Constipation improved compared to previous on Linzess 72 mcg.  Her lower abdominal pain typically resolves after bowel movement.  Further pain descriptor below.  She does have some straining about once or twice a week, does have continued sensation of incomplete emptying.  Typically has small stools.  She states she is not eating as much.  Recommended she "eats to live" including smaller meals more frequently.  Recommend adequate fiber intake.  Continue adequate water intake.  I will increase her Linzess to 145 mcg to see if this makes a difference and improves her abdominal pain as well.  Request progress report 1 to 2 weeks, follow-up in 3 months.

## 2017-09-15 NOTE — Assessment & Plan Note (Signed)
No further rectal bleeding.  Likely due to constipation.  Continue to monitor.  Follow-up in 3 months.

## 2017-09-15 NOTE — Progress Notes (Signed)
Referring Provider: Raiford Simmonds., PA-C Primary Care Physician:  Raiford Simmonds., PA-C Primary GI:  Dr. Gala Romney  Chief Complaint  Patient presents with  . Constipation  . Abdominal Pain    lower abd    HPI:   Sabrina Perez is a 27 y.o. female who presents for follow-up on constipation.  Patient was last seen in our office 03/17/2017.  Colonoscopy up-to-date 2018 with recommended 3-year repeat.  At her last visit she is doing well overall, only one incident of rectal bleeding when she stopped Linzess thinking she did not need it.  Constipation well controlled on Linzess with bowel movement twice a day consistently [place] 4.  No other GI symptoms.  Continue Linzess 72 mcg daily, follow-up in 6 months.  Today she states she's doing well overall. Constipation doing well, not as often as it was. She was having lower abdominal pain and though it was a UTI but urine tested fine and recommended follow-up with GI. Has a bowel movement at least once every 2 days. Drinking adequate fluids. If she drinks a lot, will have softer/runny stools. Has straining with a small stool which occurs about once a week. Has sensation of incomplete emptying. Abdominal pain typically resolves after a bowel movement. She does have some relief at times when she urinates. Denies hematochezia, melena, dysuria, fever, chills, unintentional weight loss. Denies chest pain, dyspnea, dizziness, lightheadedness, syncope, near syncope. Does have some early sateity which affects her appetite. If she eats "more normally" she has better bowel movement. Denies any other upper or lower GI symptoms.  Past Medical History:  Diagnosis Date  . Asthma   . Environmental and seasonal allergies   . GERD (gastroesophageal reflux disease)     Past Surgical History:  Procedure Laterality Date  . COLONOSCOPY WITH PROPOFOL N/A 10/29/2016   Procedure: COLONOSCOPY WITH PROPOFOL;  Surgeon: Daneil Dolin, MD;  [facility];   Service: Endoscopy;  Laterality: N/A;  1230   . POLYPECTOMY  10/29/2016   Procedure: POLYPECTOMY;  Surgeon: Daneil Dolin, MD;  Location: [facility];  Service: Endoscopy;;  colon  . WISDOM TOOTH EXTRACTION      Current Outpatient Medications  Medication Sig Dispense Refill  . albuterol (PROVENTIL HFA;VENTOLIN HFA) 108 (90 BASE) MCG/ACT inhaler Inhale 1-2 puffs into the lungs every 6 (six) hours as needed for wheezing or shortness of breath. 1 Inhaler 0  . beclomethasone (QVAR) 40 MCG/ACT inhaler Inhale 1 puff into the lungs 2 (two) times daily.    . calcium carbonate (TUMS - DOSED IN MG ELEMENTAL CALCIUM) 500 MG chewable tablet Chew 1-2 tablets by mouth 4 (four) times daily as needed for indigestion or heartburn.     . linaclotide (LINZESS) 72 MCG capsule Take 1 capsule (72 mcg total) by mouth daily before breakfast. 30 capsule 2  . loratadine (CLARITIN) 10 MG tablet Take 10 mg by mouth daily as needed (for allergiese.).     Marland Kitchen triamcinolone cream (KENALOG) 0.1 % Apply 1 application topically 2 (two) times daily as needed. For eczema  2   No current facility-administered medications for this visit.     Allergies as of 09/15/2017 - Review Complete 09/15/2017  Allergen Reaction Noted  . Motrin [ibuprofen] Palpitations 11/06/2012    Family History  Problem Relation Age of Onset  . Anxiety disorder Mother   . Drug abuse Father   . Alcohol abuse Maternal Uncle   . Alcohol abuse Paternal Uncle   .  Drug abuse Paternal Uncle   . Anxiety disorder Paternal Aunt   . Depression Paternal Aunt   . Colon polyps Paternal Aunt   . Colon cancer Neg Hx     Social History   Socioeconomic History  . Marital status: Single    Spouse name: Not on file  . Number of children: Not on file  . Years of education: Not on file  . Highest education level: Not on file  Occupational History  . Not on file  Social Needs  . Financial resource strain: Not on file  . Food insecurity:    Worry: Not on  file    Inability: Not on file  . Transportation needs:    Medical: Not on file    Non-medical: Not on file  Tobacco Use  . Smoking status: Current Every Day Smoker    Packs/day: 1.00    Years: 4.00    Pack years: 4.00    Types: Cigarettes  . Smokeless tobacco: Never Used  Substance and Sexual Activity  . Alcohol use: Yes    Comment: wine once a week  . Drug use: No    Comment: Per patient last used marijuana about 1 1/69months ago; denied 09/15/17  . Sexual activity: Never    Birth control/protection: Injection  Lifestyle  . Physical activity:    Days per week: Not on file    Minutes per session: Not on file  . Stress: Not on file  Relationships  . Social connections:    Talks on phone: Not on file    Gets together: Not on file    Attends religious service: Not on file    Active member of club or organization: Not on file    Attends meetings of clubs or organizations: Not on file    Relationship status: Not on file  Other Topics Concern  . Not on file  Social History Narrative  . Not on file    Review of Systems: General: Negative for anorexia, weight loss, fever, chills, fatigue, weakness. ENT: Negative for hoarseness, difficulty swallowing. CV: Negative for chest pain, angina, palpitations, peripheral edema.  Respiratory: Negative for dyspnea at rest, cough, sputum, wheezing.  GI: See history of present illness. GU:  Negative for dysuria, hematuria.  Endo: Negative for unusual weight change.  Heme: Negative for bruising or bleeding.   Physical Exam: BP (!) 146/90   Pulse 91   Temp (!) 96.9 F (36.1 C) (Oral)   Ht 5' 2.75" (1.594 m)   Wt 259 lb 3.2 oz (117.6 kg)   BMI 46.28 kg/m  General:   Alert and oriented. Pleasant and cooperative. Well-nourished and well-developed.  Eyes:  Without icterus, sclera clear and conjunctiva pink.  Ears:  Normal auditory acuity. Cardiovascular:  S1, S2 present without murmurs appreciated. Extremities without clubbing or  edema. Respiratory:  Clear to auscultation bilaterally. No wheezes, rales, or rhonchi. No distress.  Gastrointestinal:  +BS, soft, non-tender and non-distended. No HSM noted. No guarding or rebound. No masses appreciated.  Rectal:  Deferred  Musculoskalatal:  Symmetrical without gross deformities. Skin:  Intact without significant lesions or rashes. Neurologic:  Alert and oriented x4;  grossly normal neurologically. Psych:  Alert and cooperative. Normal mood and affect. Heme/Lymph/Immune: No excessive bruising noted.    09/15/2017 10:17 AM   Disclaimer: This note was dictated with voice recognition software. Similar sounding words can inadvertently be transcribed and may not be corrected upon review.

## 2017-09-15 NOTE — Patient Instructions (Signed)
1. I will give you samples of Linzess 145 mcg pills.  Take these once a day, like you do with the 72 mcg pills daily. 2. In 1 to 2 weeks, let us know if the new dose of 145 mcg is helping your abdominal pain, straining, feeling like you are not empty completely after a bowel movement. 3. Try to eat a nutritious diet.  If you need to, you can eat smaller more frequent meals during the day. 4. Follow-up in 3 months. 5. Call us if you have any questions or concerns.   It was great to see you today!  I hope you have a wonderful summer!!!    At [facility] we value your feedback. You may receive a survey about your visit today. Please share your experience as we strive to create trusting relationships with our patients to provide genuine, compassionate, quality care.

## 2017-09-27 ENCOUNTER — Telehealth: Payer: Self-pay | Admitting: Internal Medicine

## 2017-09-27 DIAGNOSIS — K625 Hemorrhage of anus and rectum: Secondary | ICD-10-CM

## 2017-09-27 DIAGNOSIS — K5900 Constipation, unspecified: Secondary | ICD-10-CM

## 2017-09-27 NOTE — Telephone Encounter (Signed)
Patient said the samples of Linzess were working and she needs a Rx called into [facility]

## 2017-09-27 NOTE — Telephone Encounter (Signed)
EG pt called back with an update and is doing well with Linzess 72 mcg. Pt would like medication called into her pharmacy.

## 2017-09-28 ENCOUNTER — Other Ambulatory Visit: Payer: Self-pay | Admitting: Nurse Practitioner

## 2017-09-28 DIAGNOSIS — K625 Hemorrhage of anus and rectum: Secondary | ICD-10-CM

## 2017-09-28 DIAGNOSIS — K5900 Constipation, unspecified: Secondary | ICD-10-CM

## 2017-09-28 MED ORDER — LINACLOTIDE 72 MCG PO CAPS: 72.0000 ug | ORAL_CAPSULE | Freq: Every day | ORAL | 5 refills | Status: DC

## 2017-09-28 NOTE — Telephone Encounter (Signed)
Done. Please notify patient.

## 2017-09-28 NOTE — Telephone Encounter (Signed)
Noted. Tried calling pt and her mailbox id [email or web address]. Will call again.

## 2017-09-28 NOTE — Addendum Note (Signed)
Addended by: Gordy Levan, ERIC A on: 09/28/2017 12:16 PM   Modules accepted: Orders

## 2017-09-28 NOTE — Telephone Encounter (Signed)
Tried calling pt, medication was sent into pts pharmacy.

## 2017-10-05 ENCOUNTER — Telehealth: Payer: Self-pay | Admitting: Internal Medicine

## 2017-10-05 ENCOUNTER — Telehealth: Payer: Self-pay

## 2017-10-05 DIAGNOSIS — K5900 Constipation, unspecified: Secondary | ICD-10-CM

## 2017-10-05 DIAGNOSIS — K625 Hemorrhage of anus and rectum: Secondary | ICD-10-CM

## 2017-10-05 MED ORDER — LINACLOTIDE 145 MCG PO CAPS: 145.0000 ug | ORAL_CAPSULE | Freq: Every day | ORAL | 11 refills | Status: DC

## 2017-10-05 NOTE — Telephone Encounter (Signed)
Pt called last week and gave an update of the samples of Linzess 145 mcg that she was given at her last ov. EG sent in Linzess 72 mcg which was the previous prescription pt was taking. Pt would like the Linzess 145 mcg which is helping her constipation, called into [facility].

## 2017-10-05 NOTE — Telephone Encounter (Signed)
Closing not out. Documentation will be under a new note.

## 2017-10-05 NOTE — Telephone Encounter (Signed)
PLEASE CALL PATIENT, HER INSURANCE IS NOT GOING TO PAY FOR HER MEDICATION THE WAY THE PRESCRIPTION IS WRITTEN  [phone]

## 2017-10-05 NOTE — Addendum Note (Signed)
Addended by: Mahala Menghini on: 10/05/2017 01:00 PM   Modules accepted: Orders

## 2017-12-15 ENCOUNTER — Telehealth: Payer: Self-pay | Admitting: Nurse Practitioner

## 2017-12-15 ENCOUNTER — Encounter: Payer: Self-pay | Admitting: Internal Medicine

## 2017-12-15 ENCOUNTER — Ambulatory Visit: Payer: Medicaid Other | Admitting: Nurse Practitioner

## 2017-12-15 NOTE — Progress Notes (Deleted)
Referring Provider: Raiford Simmonds., PA-C Primary Care Physician:  Raiford Simmonds., PA-C Primary GI:  Dr. Gala Romney  No chief complaint on file.   HPI:   Sabrina Perez is a 27 y.o. female who presents for follow-up on constipation and abdominal pain.  The patient was last seen in our office 09/15/2017 for the same as well as rectal bleeding.  Colonoscopy up-to-date 2018 with recommended 3-year repeat.  Constipation well controlled on Linzess with twice daily bowel movement consistent with [place] 4.  At her last visit she was doing well and not having bowel movements as often as previously on Linzess.  Some lower abdominal pain and thought it was UTI but urine test did find and she was recommended to follow-up with GI.  Has a bowel movement at least once every 2 days, drinks adequate fluids.  Some straining with small stool which occurs about once a week.  Sensation of incomplete emptying.  Abdominal pain typically resolves after a bowel movement and some relief after she urinates.  No other GI symptoms.  She does have some early satiety but indicates that she eats "more normally" she has better bowel movements.  Recommended increase Linzess to 145 mcg and call with a progress report.  Attempt nutritious diet with smaller, more frequent meals.  Follow-up in 3 months.  She called our office 09/27/2017 indicated the increased dose was more effective and a prescription was sent to her pharmacy.  Today she states   Past Medical History:  Diagnosis Date  . Asthma   . Environmental and seasonal allergies   . GERD (gastroesophageal reflux disease)     Past Surgical History:  Procedure Laterality Date  . COLONOSCOPY WITH PROPOFOL N/A 10/29/2016   Procedure: COLONOSCOPY WITH PROPOFOL;  Surgeon: Daneil Dolin, MD;  [facility]: [facility];  Service: Endoscopy;  Laterality: N/A;  1230   . POLYPECTOMY  10/29/2016   Procedure: POLYPECTOMY;  Surgeon: Daneil Dolin, MD;  [facility]: [facility];   Service: Endoscopy;;  colon  . WISDOM TOOTH EXTRACTION      Current Outpatient Medications  Medication Sig Dispense Refill  . albuterol (PROVENTIL HFA;VENTOLIN HFA) 108 (90 BASE) MCG/ACT inhaler Inhale 1-2 puffs into the lungs every 6 (six) hours as needed for wheezing or shortness of breath. 1 Inhaler 0  . beclomethasone (QVAR) 40 MCG/ACT inhaler Inhale 1 puff into the lungs 2 (two) times daily.    . calcium carbonate (TUMS - DOSED IN MG ELEMENTAL CALCIUM) 500 MG chewable tablet Chew 1-2 tablets by mouth 4 (four) times daily as needed for indigestion or heartburn.     . linaclotide (LINZESS) 145 MCG CAPS capsule Take 1 capsule (145 mcg total) by mouth daily before breakfast. 30 capsule 11  . loratadine (CLARITIN) 10 MG tablet Take 10 mg by mouth daily as needed (for allergiese.).     Marland Kitchen triamcinolone cream (KENALOG) 0.1 % Apply 1 application topically 2 (two) times daily as needed. For eczema  2   No current facility-administered medications for this visit.     Allergies as of 12/15/2017 - Review Complete 09/15/2017  Allergen Reaction Noted  . Motrin [ibuprofen] Palpitations 11/06/2012    Family History  Problem Relation Age of Onset  . Anxiety disorder Mother   . Drug abuse Father   . Alcohol abuse Maternal Uncle   . Alcohol abuse Paternal Uncle   . Drug abuse Paternal Uncle   . Anxiety disorder Paternal Aunt   . Depression  Paternal Aunt   . Colon polyps Paternal Aunt   . Colon cancer Neg Hx     Social History   Socioeconomic History  . Marital status: Single    Spouse name: Not on file  . Number of children: Not on file  . Years of education: Not on file  . Highest education level: Not on file  Occupational History  . Not on file  Social Needs  . Financial resource strain: Not on file  . Food insecurity:    Worry: Not on file    Inability: Not on file  . Transportation needs:    Medical: Not on file    Non-medical: Not on file  Tobacco Use  . Smoking status:  Current Every Day Smoker    Packs/day: 1.00    Years: 4.00    Pack years: 4.00    Types: Cigarettes  . Smokeless tobacco: Never Used  Substance and Sexual Activity  . Alcohol use: Yes    Comment: wine once a week  . Drug use: No    Comment: Per patient last used marijuana about 1 1/61months ago; denied 09/15/17  . Sexual activity: Never    Birth control/protection: Injection  Lifestyle  . Physical activity:    Days per week: Not on file    Minutes per session: Not on file  . Stress: Not on file  Relationships  . Social connections:    Talks on phone: Not on file    Gets together: Not on file    Attends religious service: Not on file    Active member of club or organization: Not on file    Attends meetings of clubs or organizations: Not on file    Relationship status: Not on file  Other Topics Concern  . Not on file  Social History Narrative  . Not on file    Review of Systems: General: Negative for anorexia, weight loss, fever, chills, fatigue, weakness. Eyes: Negative for vision changes.  ENT: Negative for hoarseness, difficulty swallowing , nasal congestion. CV: Negative for chest pain, angina, palpitations, dyspnea on exertion, peripheral edema.  Respiratory: Negative for dyspnea at rest, dyspnea on exertion, cough, sputum, wheezing.  GI: See history of present illness. GU:  Negative for dysuria, hematuria, urinary incontinence, urinary frequency, nocturnal urination.  MS: Negative for joint pain, low back pain.  Derm: Negative for rash or itching.  Neuro: Negative for weakness, abnormal sensation, seizure, frequent headaches, memory loss, confusion.  Psych: Negative for anxiety, depression, suicidal ideation, hallucinations.  Endo: Negative for unusual weight change.  Heme: Negative for bruising or bleeding. Allergy: Negative for rash or hives.   Physical Exam: There were no vitals taken for this visit. General:   Alert and oriented. Pleasant and cooperative.  Well-nourished and well-developed.  Head:  Normocephalic and atraumatic. Eyes:  Without icterus, sclera clear and conjunctiva pink.  Ears:  Normal auditory acuity. Mouth:  No deformity or lesions, oral mucosa pink.  Throat/Neck:  Supple, without mass or thyromegaly. Cardiovascular:  S1, S2 present without murmurs appreciated. Normal pulses noted. Extremities without clubbing or edema. Respiratory:  Clear to auscultation bilaterally. No wheezes, rales, or rhonchi. No distress.  Gastrointestinal:  +BS, soft, non-tender and non-distended. No HSM noted. No guarding or rebound. No masses appreciated.  Rectal:  Deferred  Musculoskalatal:  Symmetrical without gross deformities. Normal posture. Skin:  Intact without significant lesions or rashes. Neurologic:  Alert and oriented x4;  grossly normal neurologically. Psych:  Alert and cooperative. Normal mood and  affect. Heme/Lymph/Immune: No significant cervical adenopathy. No excessive bruising noted.    12/15/2017 8:42 AM   Disclaimer: This note was dictated with voice recognition software. Similar sounding words can inadvertently be transcribed and may not be corrected upon review.

## 2017-12-15 NOTE — Telephone Encounter (Signed)
PATIENT WAS A NO SHOW AND LETTER SENT  °

## 2018-11-16 ENCOUNTER — Encounter: Payer: Self-pay | Admitting: Nurse Practitioner

## 2018-11-16 ENCOUNTER — Encounter: Payer: Self-pay | Admitting: Internal Medicine

## 2018-11-16 ENCOUNTER — Other Ambulatory Visit: Payer: Self-pay

## 2018-11-16 ENCOUNTER — Ambulatory Visit (INDEPENDENT_AMBULATORY_CARE_PROVIDER_SITE_OTHER): Payer: Medicaid Other | Admitting: Nurse Practitioner

## 2018-11-16 VITALS — BP 130/75 | HR 90 | Temp 97.5°F | Ht 62.0 in | Wt 253.0 lb

## 2018-11-16 DIAGNOSIS — R1030 Lower abdominal pain, unspecified: Secondary | ICD-10-CM

## 2018-11-16 DIAGNOSIS — K625 Hemorrhage of anus and rectum: Secondary | ICD-10-CM | POA: Diagnosis not present

## 2018-11-16 DIAGNOSIS — K5900 Constipation, unspecified: Secondary | ICD-10-CM | POA: Diagnosis not present

## 2018-11-16 MED ORDER — LINACLOTIDE 145 MCG PO CAPS: 145.0000 ug | ORAL_CAPSULE | Freq: Every day | ORAL | 3 refills | Status: DC

## 2018-11-16 NOTE — Progress Notes (Signed)
Referring Provider: Raiford Simmonds., PA-C Primary Care Physician:  Raiford Simmonds., PA-C Primary GI:  Dr. Gala Romney  Chief Complaint  Patient presents with  . Constipation    Was on Linzess. pt needs a refill  . Abdominal Pain    bloating  . Blood In Stools    pt isn't sure if it was from strainging or in stool.     HPI:   Sabrina Perez is a 28 y.o. female who presents for constipation, abdominal pain, rectal bleeding.  She is previously seen in our office 09/15/2017 for the same.  Colonoscopy up-to-date 2018 with recommended 3-year repeat (2021).  Constipation well controlled on Linzess historically.  At her last visit she stated constipation doing well, not as often as it was.  Lower abdominal pain initially thought to be UTI but UA was negative.  Straining with small stools about once a week, sensation of incomplete emptying, abdominal pain typically resolves after a bowel movement.  No other significant GI complaints.  Recommended Linzess 145 mcg (increased compared to her previous dose of 72 mcg).  Call with progress report 1 to 2 weeks, eat a nutritious diet with smaller more frequent meals, follow-up in 3 months.  The patient called our office 09/27/2017 indicating [place] working well and requested it to be sent to the pharmacy.  Previous colonoscopy completed 10/29/2016 which found a 13 mm polyp in the proximal sigmoid colon which was pedunculated and polypectomy stalk clipped x1 prophylactically.  Otherwise normal exam.  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  Recommended repeat colonoscopy in 3 years (June 2021).  The patient was a no-show to follow-up on 12/15/2017.  Today she states she's doing ok overall. She ran out of Linzess and was told by the office she had not been seen since 2018 and would need a follow-up appointment for more refills. Has been significantly constipated, hard stools, a lot of straining. The other day she had some toilet tissue  hematochezia and minimal blood in the commode after significant straining. No bleeding while she was on Linzess. Is having abdominal pain as well lower abdomen. This pain was also better when on Linzess. Has some increased nausea as well, no vomiting. Significant bloating after eating as well. All symptoms started when she ran out of [place]. Denies melena, fever, chills, unintentional weight loss. Denies URI or flu-like symptoms. Denies loss of sense of taste or smell. Denies chest pain, dyspnea, dizziness, lightheadedness, syncope, near syncope. Denies any other upper or lower GI symptoms.  Past Medical History:  Diagnosis Date  . Asthma   . Environmental and seasonal allergies   . GERD (gastroesophageal reflux disease)     Past Surgical History:  Procedure Laterality Date  . COLONOSCOPY WITH PROPOFOL N/A 10/29/2016   Procedure: COLONOSCOPY WITH PROPOFOL;  Surgeon: Daneil Dolin, MD;  [facility];  Service: Endoscopy;  Laterality: N/A;  1230   . POLYPECTOMY  10/29/2016   Procedure: POLYPECTOMY;  Surgeon: Daneil Dolin, MD;  [facility];  Service: Endoscopy;;  colon  . WISDOM TOOTH EXTRACTION      Current Outpatient Medications  Medication Sig Dispense Refill  . albuterol (PROVENTIL HFA;VENTOLIN HFA) 108 (90 BASE) MCG/ACT inhaler Inhale 1-2 puffs into the lungs every 6 (six) hours as needed for wheezing or shortness of breath. 1 Inhaler 0  . beclomethasone (QVAR) 40 MCG/ACT inhaler Inhale 1 puff into the lungs 2 (two) times daily.    Marland Kitchen  calcium carbonate (TUMS - DOSED IN MG ELEMENTAL CALCIUM) 500 MG chewable tablet Chew 1-2 tablets by mouth 4 (four) times daily as needed for indigestion or heartburn.     . loratadine (CLARITIN) 10 MG tablet Take 10 mg by mouth daily as needed (for allergiese.).     Marland Kitchen linaclotide (LINZESS) 145 MCG CAPS capsule Take 1 capsule (145 mcg total) by mouth daily before breakfast. 90 capsule 3   No current facility-administered medications  for this visit.     Allergies as of 11/16/2018 - Review Complete 11/16/2018  Allergen Reaction Noted  . Motrin [ibuprofen] Palpitations 11/06/2012    Family History  Problem Relation Age of Onset  . Anxiety disorder Mother   . Drug abuse Father   . Alcohol abuse Maternal Uncle   . Alcohol abuse Paternal Uncle   . Drug abuse Paternal Uncle   . Anxiety disorder Paternal Aunt   . Depression Paternal Aunt   . Colon polyps Paternal Aunt   . Colon cancer Neg Hx     Social History   Socioeconomic History  . Marital status: Single    Spouse name: Not on file  . Number of children: Not on file  . Years of education: Not on file  . Highest education level: Not on file  Occupational History  . Not on file  Social Needs  . Financial resource strain: Not on file  . Food insecurity    Worry: Not on file    Inability: Not on file  . Transportation needs    Medical: Not on file    Non-medical: Not on file  Tobacco Use  . Smoking status: Current Every Day Smoker    Packs/day: 1.00    Years: 4.00    Pack years: 4.00    Types: Cigarettes  . Smokeless tobacco: Never Used  Substance and Sexual Activity  . Alcohol use: Yes    Comment: wine once a week  . Drug use: No    Comment: Per patient last used marijuana about 1 1/20months ago; denied 09/15/17  . Sexual activity: Never    Birth control/protection: Injection  Lifestyle  . Physical activity    Days per week: Not on file    Minutes per session: Not on file  . Stress: Not on file  Relationships  . Social Herbalist on phone: Not on file    Gets together: Not on file    Attends religious service: Not on file    Active member of club or organization: Not on file    Attends meetings of clubs or organizations: Not on file    Relationship status: Not on file  Other Topics Concern  . Not on file  Social History Narrative  . Not on file    Review of Systems: General: Negative for anorexia, weight loss, fever,  chills, fatigue, weakness. ENT: Negative for hoarseness, difficulty swallowing. CV: Negative for chest pain, angina, palpitations, peripheral edema.  Respiratory: Negative for dyspnea at rest, cough, sputum, wheezing.  GI: See history of present illness. Endo: Negative for unusual weight change.  Heme: Negative for bruising or bleeding. Allergy: Negative for rash or hives.   Physical Exam: BP 130/75   Pulse 90   Temp (!) 97.5 F (36.4 C) (Oral)   Ht 5\' 2"  (1.575 m)   Wt 253 lb (114.8 kg)   BMI 46.27 kg/m  General:   Alert and oriented. Pleasant and cooperative. Well-nourished and well-developed.  Eyes:  Without icterus,  sclera clear and conjunctiva pink.  Ears:  Normal auditory acuity. Cardiovascular:  S1, S2 present without murmurs appreciated. Extremities without clubbing or edema. Respiratory:  Clear to auscultation bilaterally. No wheezes, rales, or rhonchi. No distress.  Gastrointestinal:  +BS, soft, non-tender and non-distended. No HSM noted. No guarding or rebound. No masses appreciated.  Rectal:  Deferred  Musculoskalatal:  Symmetrical without gross deformities. Neurologic:  Alert and oriented x4;  grossly normal neurologically. Psych:  Alert and cooperative. Normal mood and affect. Heme/Lymph/Immune: No excessive bruising noted.    11/16/2018 3:56 PM   Disclaimer: This note was dictated with voice recognition software. Similar sounding words can inadvertently be transcribed and may not be corrected upon review.

## 2018-11-16 NOTE — Assessment & Plan Note (Signed)
Increase in abdominal pain which is lower abdominal since she ran out of Linzess and has had worsening constipation.  I feel her abdominal pain is likely due to her constipation.  Previously resolved after starting Linzess.  At this point we will refill her medication as per above.  Follow-up in 3 months.  Call for any worsening or severe symptoms.

## 2018-11-16 NOTE — Progress Notes (Signed)
cc'ed to pcp °

## 2018-11-16 NOTE — Patient Instructions (Signed)
Your health issues we discussed today were:   Constipation with abdominal pain, bloating, nausea, rectal bleeding: 1. I am sending in a refill of Linzess 145 mcg.  Take this once a day on an empty stomach 2. Call us if you have any significant worsening 3. Call us if your rectal bleeding is persistent even after your constipation improves  Overall I recommend:  1. Continue your other current medications 2. Follow-up in 3 months 3. Call us if you have any questions or concerns.   Because of recent events of COVID-19 ("Coronavirus"), follow CDC recommendations:  1. Wash your hand frequently 2. Avoid touching your face 3. Stay away from people who are sick 4. If you have symptoms such as fever, cough, shortness of breath then call your healthcare provider for further guidance 5. If you are sick, STAY AT HOME unless otherwise directed by your healthcare provider. 6. Follow directions from state and national officials regarding staying safe   At [facility] we value your feedback. You may receive a survey about your visit today. Please share your experience as we strive to create trusting relationships with our patients to provide genuine, compassionate, quality care.  We appreciate your understanding and patience as we review any laboratory studies, imaging, and other diagnostic tests that are ordered as we care for you. Our office policy is 5 business days for review of these results, and any emergent or urgent results are addressed in a timely manner for your best interest. If you do not hear from our office in 1 week, please contact us.   We also encourage the use of MyChart, which contains your medical information for your review as well. If you are not enrolled in this feature, an access code is on this after visit summary for your convenience. Thank you for allowing [place] to be involved in your care.  It was great to see you today!  I hope you have a great summer!!

## 2018-11-16 NOTE — Assessment & Plan Note (Signed)
Scant to minimal rectal bleeding with significant constipation and straining when she ran out of Linzess.  Colonoscopy up-to-date 2018.  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  She is due for repeat colonoscopy in 1 year (June 2021).  If she continues to have rectal bleeding despite improvement in constipation we can consider early interval colonoscopy.  Otherwise monitor for any worsening.  She is aware to call us immediately if she notes any significant rectal bleeding.

## 2018-11-16 NOTE — Assessment & Plan Note (Signed)
Significant constipation that was previously mildly well managed on Linzess 72 mcg.  We increased her dose to 145 mcg daily and she has had significant improvement.  She ran out of Linzess and try to get a referral but there is some confusion about her most recent seen date and it felt that she was out of range to obtain a refill.  We will refill her Linzess 145 mcg today.  I feel that all of her other symptoms, per her description, started when her constipation worsened including abdominal discomfort, low back pain, bloating, nausea.  I will have her call us in 2 to 4 weeks if no improvement.  Follow-up in 3 months otherwise.

## 2019-02-22 ENCOUNTER — Encounter: Payer: Self-pay | Admitting: Nurse Practitioner

## 2019-02-22 ENCOUNTER — Ambulatory Visit (INDEPENDENT_AMBULATORY_CARE_PROVIDER_SITE_OTHER): Payer: Medicaid Other | Admitting: Nurse Practitioner

## 2019-02-22 ENCOUNTER — Other Ambulatory Visit: Payer: Self-pay

## 2019-02-22 ENCOUNTER — Encounter: Payer: Self-pay | Admitting: Internal Medicine

## 2019-02-22 DIAGNOSIS — R112 Nausea with vomiting, unspecified: Secondary | ICD-10-CM

## 2019-02-22 DIAGNOSIS — K5900 Constipation, unspecified: Secondary | ICD-10-CM

## 2019-02-22 DIAGNOSIS — K219 Gastro-esophageal reflux disease without esophagitis: Secondary | ICD-10-CM

## 2019-02-22 HISTORY — DX: Nausea with vomiting, unspecified: R11.2

## 2019-02-22 MED ORDER — OMEPRAZOLE 40 MG PO CPDR: 40.0000 mg | DELAYED_RELEASE_CAPSULE | Freq: Every day | ORAL | 3 refills | Status: DC

## 2019-02-22 NOTE — Progress Notes (Signed)
Referring Provider: Raiford Simmonds., PA-C Primary Care Physician:  Raiford Simmonds., PA-C Primary GI:  Dr. Gala Romney  NOTE: Service was provided via telemedicine and was requested by the patient due to COVID-19 pandemic.  Method of visit: Doxy.Me  Patient Location: Home  Provider Location: Office  Reason for Phone Visit: Follow-up  The patient was consented to phone follow-up via telephone encounter including billing of the encounter (yes/no): Yes  Persons present on the phone encounter, with roles: child and her mom  Total time (minutes) spent on medical discussion: 23 minutes  Chief Complaint  Patient presents with  . Abdominal Pain    every now and then  . Constipation    taking meds and it helps with BM's, no bleeding    HPI:   Sabrina Perez is a 28 y.o. female who presents for virtual visit regarding: Follow-up for constipation and abdominal pain.  Patient was last seen in our office 11/16/2018 for rectal bleeding, constipation, abdominal pain.  Colonoscopy up-to-date 2018 with recommended 3-year repeat (2021).  Historically constipation controlled on Linzess.  Previous colonoscopy completed 10/29/2016 with a 13 mm polyp in the proximal sigmoid colon that was pedunculated and polypectomy stalk clipped x1 prophylactically, otherwise normal.  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  At her last visit she states she ran out of Linzess and was recommended to have a follow-up for refills.  Noted significantly constipated with hard stools and straining.  Some toilet tissue hematochezia after significant straining, of note she indicated no bleeding while on Linzess.  Abdominal pain in the lower abdomen that was also much improved on Linzess.  Significant postprandial bloating.  All of the symptoms began after she ran out of her medications.  No other GI complaints.  Recommended restart Linzess 145 mcg, continue other medications,  follow-up in 3 months.  Today she states she's doing better. Has a bowel movement about every day to every 2 days. Stools soft, pass easily, consistent with Bristol 7. Some intermittent mild lower abdominal discomfort that typically resolves with a bowel movement. Currently she is satisfied with her bowel movements. She has been getting GERD symptoms lately; previously managed with dietary changes. Has only been taking TUMS. Has had some "out of nowhere" nausea with vomiting. This hasn't happened since. Denies hematochezia, melena, fever, chills, unintentional weight loss. Denies URI or flu-like symptoms. Denies loss of sense of taste or smell. Denies chest pain, dyspnea, dizziness, lightheadedness, syncope, near syncope. Denies any other upper or lower GI symptoms.  Past Medical History:  Diagnosis Date  . Asthma   . Environmental and seasonal allergies   . GERD (gastroesophageal reflux disease)     Past Surgical History:  Procedure Laterality Date  . COLONOSCOPY WITH PROPOFOL N/A 10/29/2016   Procedure: COLONOSCOPY WITH PROPOFOL;  Surgeon: Daneil Dolin, MD;  [facility];  Service: Endoscopy;  Laterality: N/A;  1230   . POLYPECTOMY  10/29/2016   Procedure: POLYPECTOMY;  Surgeon: Daneil Dolin, MD;  Location: [facility];  Service: Endoscopy;;  colon  . WISDOM TOOTH EXTRACTION      Current Outpatient Medications  Medication Sig Dispense Refill  . albuterol (PROVENTIL HFA;VENTOLIN HFA) 108 (90 BASE) MCG/ACT inhaler Inhale 1-2 puffs into the lungs every 6 (six) hours as needed for wheezing or shortness of breath. 1 Inhaler 0  . beclomethasone (QVAR) 40 MCG/ACT inhaler Inhale 1 puff into the lungs 2 (two) times daily.    Marland Kitchen  calcium carbonate (TUMS - DOSED IN MG ELEMENTAL CALCIUM) 500 MG chewable tablet Chew 1-2 tablets by mouth 4 (four) times daily as needed for indigestion or heartburn.     . linaclotide (LINZESS) 145 MCG CAPS capsule Take 1 capsule (145 mcg total) by mouth  daily before breakfast. 90 capsule 3  . loratadine (CLARITIN) 10 MG tablet Take 10 mg by mouth daily as needed for allergies.      No current facility-administered medications for this visit.     Allergies as of 02/22/2019 - Review Complete 02/22/2019  Allergen Reaction Noted  . Motrin [ibuprofen] Palpitations 11/06/2012    Family History  Problem Relation Age of Onset  . Anxiety disorder Mother   . Drug abuse Father   . Alcohol abuse Maternal Uncle   . Alcohol abuse Paternal Uncle   . Drug abuse Paternal Uncle   . Anxiety disorder Paternal Aunt   . Depression Paternal Aunt   . Colon polyps Paternal Aunt   . Colon cancer Neg Hx     Social History   Socioeconomic History  . Marital status: Single    Spouse name: Not on file  . Number of children: Not on file  . Years of education: Not on file  . Highest education level: Not on file  Occupational History  . Not on file  Social Needs  . Financial resource strain: Not on file  . Food insecurity    Worry: Not on file    Inability: Not on file  . Transportation needs    Medical: Not on file    Non-medical: Not on file  Tobacco Use  . Smoking status: Current Every Day Smoker    Packs/day: 1.00    Years: 4.00    Pack years: 4.00    Types: Cigarettes  . Smokeless tobacco: Never Used  Substance and Sexual Activity  . Alcohol use: Yes    Comment: wine once a week  . Drug use: No    Comment: Per patient last used marijuana about 1 1/60months ago; denied 09/15/17  . Sexual activity: Never    Birth control/protection: Injection  Lifestyle  . Physical activity    Days per week: Not on file    Minutes per session: Not on file  . Stress: Not on file  Relationships  . Social Herbalist on phone: Not on file    Gets together: Not on file    Attends religious service: Not on file    Active member of club or organization: Not on file    Attends meetings of clubs or organizations: Not on file    Relationship  status: Not on file  Other Topics Concern  . Not on file  Social History Narrative  . Not on file    Review of Systems: General: Negative for anorexia, weight loss, fever, chills, fatigue, weakness. ENT: Negative for hoarseness, difficulty swallowing. CV: Negative for chest pain, angina, palpitations, peripheral edema.  Respiratory: Negative for dyspnea at rest, cough, sputum, wheezing.  GI: See history of present illness. Endo: Negative for unusual weight change.  Heme: Negative for bruising or bleeding. Allergy: Negative for rash or hives.  Physical Exam: Note: limited exam due to virtual visit General:   Alert and oriented. Pleasant and cooperative. Well-nourished and well-developed.  Head:  Normocephalic and atraumatic. Eyes:  Without icterus, sclera clear and conjunctiva pink.  Ears:  Normal auditory acuity. Skin:  Intact without facial significant lesions or rashes. Neurologic:  Alert and  oriented x4;  grossly normal neurologically. Psych:  Alert and cooperative. Normal mood and affect. Heme/Lymph/Immune: No excessive bruising noted.

## 2019-02-22 NOTE — Assessment & Plan Note (Signed)
Constipation adequately managed on Linzess 145 mcg.  Some rare, intermittent abdominal pain.  This typically resolves after a bowel movement.  She states overall she is satisfied with her symptoms on Linzess.  Recommend she continue her Linzess 145 mcg daily.  Follow-up in 2 months.

## 2019-02-22 NOTE — Patient Instructions (Signed)
Your health issues we discussed today were:   Constipation: 1. Continue taking Linzess as you have been 2. Call us if you have any worsening or severe symptoms  GERD (reflux/heartburn) with increased nausea and occasional vomiting: 1. I have sent in Prilosec 40 mg to your pharmacy.  Take this once a day, on an empty stomach, first thing in the morning 2. Call us and let us know if you have any worsening symptoms  Overall I recommend:  1. Continue your other current medications 2. Return for follow-up in 2 months 3. Call us if you have any questions or concerns.   Because of recent events of COVID-19 ("Coronavirus"), follow CDC recommendations:  1. Wash your hand frequently 2. Avoid touching your face 3. Stay away from people who are sick 4. If you have symptoms such as fever, cough, shortness of breath then call your healthcare provider for further guidance 5. If you are sick, STAY AT HOME unless otherwise directed by your healthcare provider. 6. Follow directions from state and national officials regarding staying safe   At [facility] we value your feedback. You may receive a survey about your visit today. Please share your experience as we strive to create trusting relationships with our patients to provide genuine, compassionate, quality care.  We appreciate your understanding and patience as we review any laboratory studies, imaging, and other diagnostic tests that are ordered as we care for you. Our office policy is 5 business days for review of these results, and any emergent or urgent results are addressed in a timely manner for your best interest. If you do not hear from our office in 1 week, please contact us.   We also encourage the use of MyChart, which contains your medical information for your review as well. If you are not enrolled in this feature, an access code is on this after visit summary for your convenience. Thank you for allowing [place] to be involved  in your care.  It was great to see you today!  I hope you have a great Fall!!

## 2019-02-22 NOTE — Assessment & Plan Note (Signed)
The patient notes she has been having some worsening GERD type symptoms.  Is only been taking Tums.  She did note some increased nausea with some vomiting, although not a lot or significant amount.  Query element of GERD, gastritis, esophagitis.  I will start her on Prilosec 40 mg daily to see if this helps her other symptoms.  Continue other medications and follow-up in 2 months.  Call for any worsening or severe symptoms.

## 2019-02-22 NOTE — Progress Notes (Signed)
cc'ed to pcp °

## 2019-02-22 NOTE — Assessment & Plan Note (Addendum)
She notes she has had some increased nausea as well as her GERD symptoms, as per below.  She did have one episode of vomiting a few nights ago that came "out of nowhere."  Query element of worsening GERD/reflux responsible for her symptoms.  We will start her on Prilosec as per below.  Follow-up in 2 months.

## 2019-04-21 IMAGING — DX DG ABDOMEN 2V
3 series · 3 of 3 positions shown · non-contrast
Comparison: None.

CLINICAL DATA: Bright red blood in stool, constipation

EXAM:
ABDOMEN - 2 VIEW

[abdomen erect]
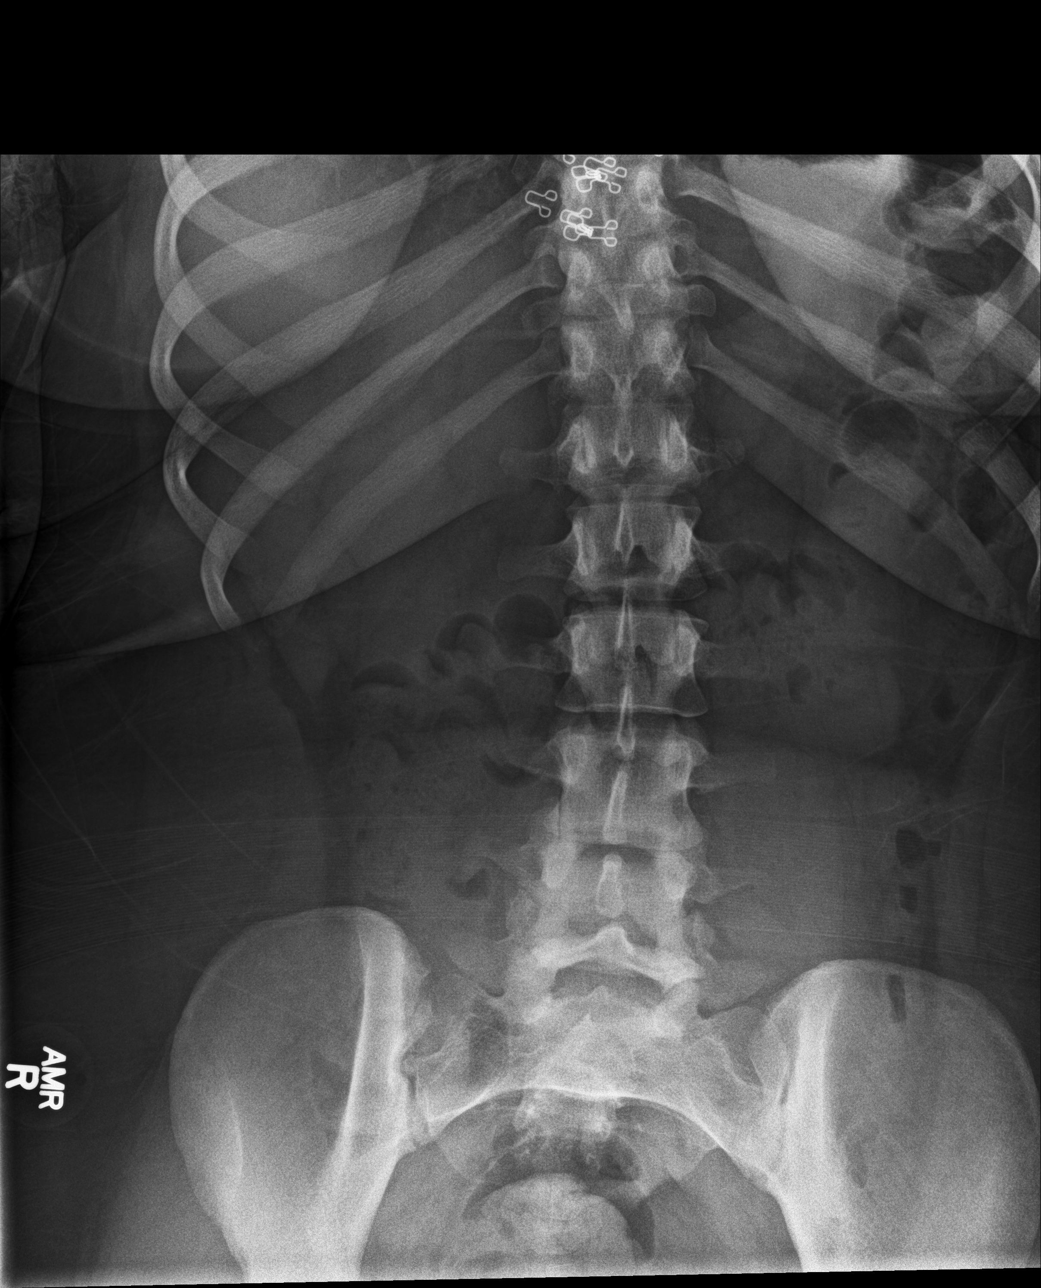

[abdomen supine (1 of 2)]
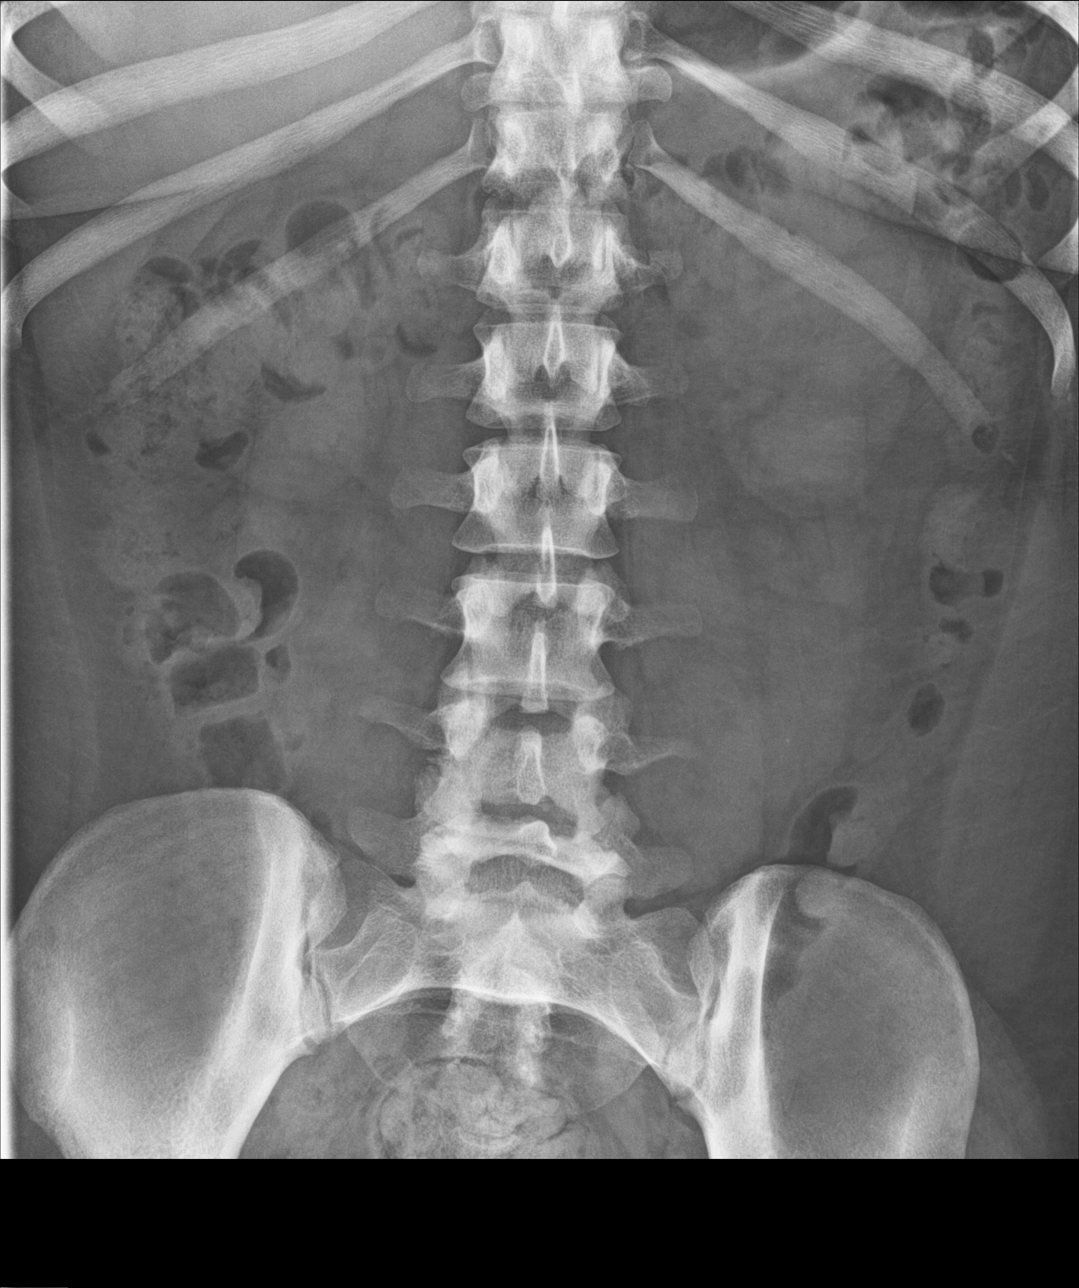

[abdomen supine (2 of 2)]
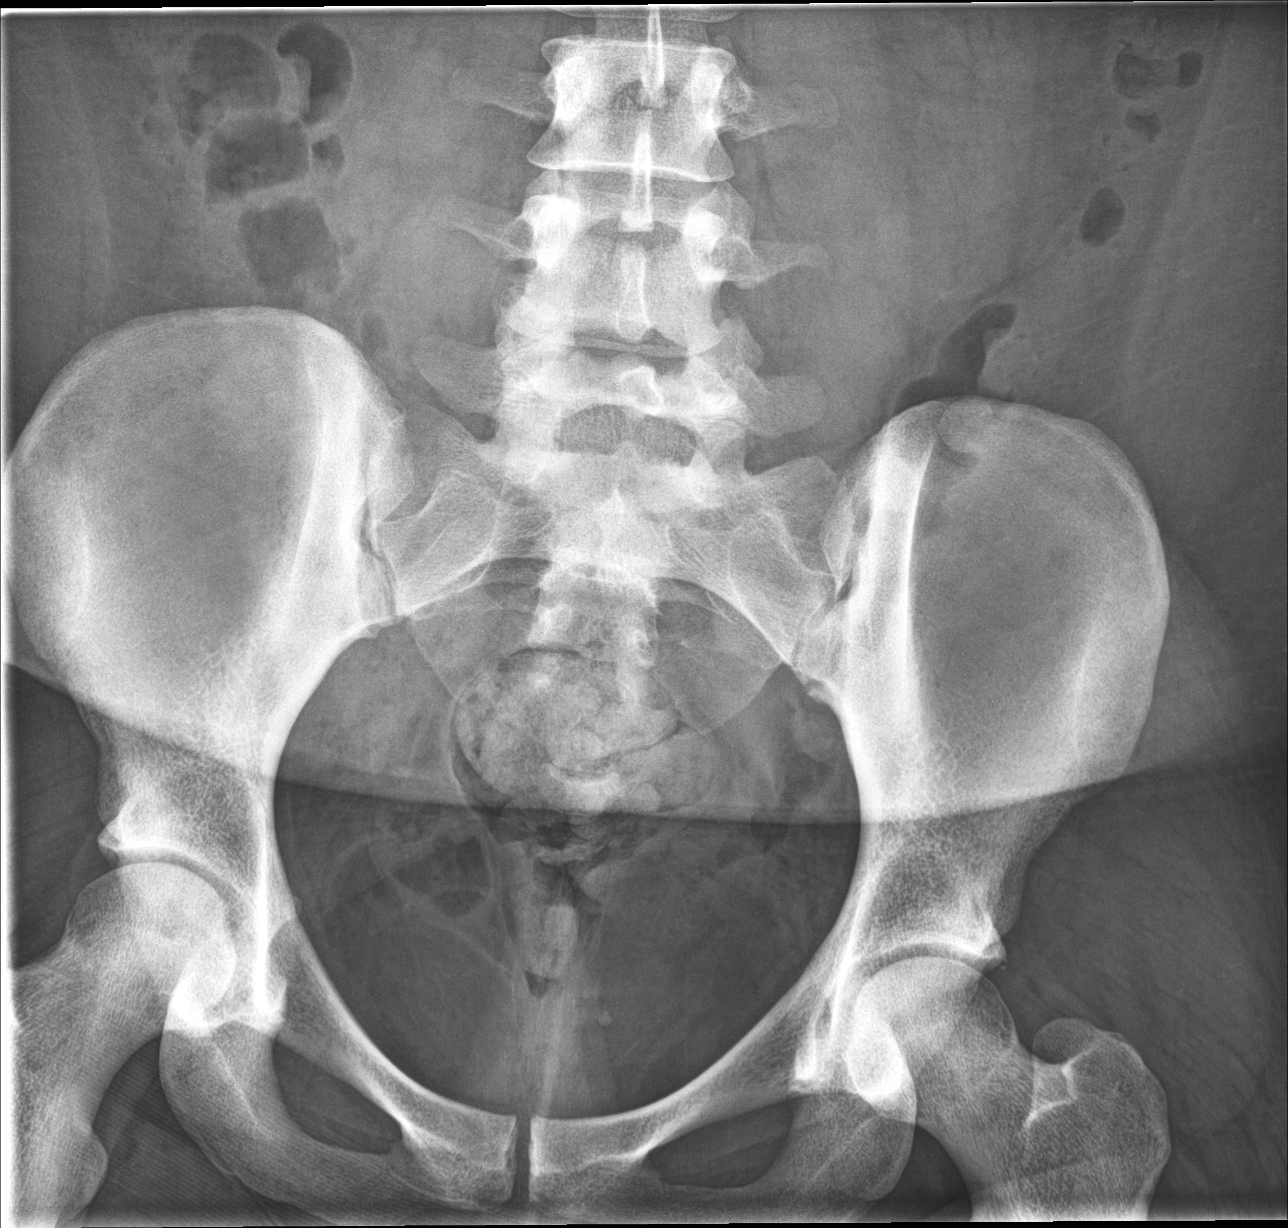

[3 of 3 positions shown; findings below may reference images not displayed]

FINDINGS: Moderate stool burden throughout the colon. There is normal bowel
gas pattern. No free air. No organomegaly or suspicious
calcification. No acute bony abnormality.
IMPRESSION: Moderate stool burden.  No acute findings.

## 2019-04-26 ENCOUNTER — Ambulatory Visit: Payer: Medicaid Other | Admitting: Nurse Practitioner

## 2019-04-26 ENCOUNTER — Encounter: Payer: Self-pay | Admitting: Nurse Practitioner

## 2019-04-26 ENCOUNTER — Other Ambulatory Visit: Payer: Self-pay

## 2019-04-26 VITALS — BP 130/84 | HR 94 | Temp 96.8°F | Ht 62.0 in | Wt 234.6 lb

## 2019-04-26 DIAGNOSIS — D649 Anemia, unspecified: Secondary | ICD-10-CM

## 2019-04-26 DIAGNOSIS — K219 Gastro-esophageal reflux disease without esophagitis: Secondary | ICD-10-CM | POA: Diagnosis not present

## 2019-04-26 DIAGNOSIS — K5900 Constipation, unspecified: Secondary | ICD-10-CM | POA: Diagnosis not present

## 2019-04-26 NOTE — Assessment & Plan Note (Signed)
At some point the patient was evaluated for anemia.  She had a colonoscopy in 2018 which found polyps and recommended 3-year repeat.  She would be due next year.  At this point we will recheck a CBC as we do not have any on record in the past 2 years.  We will have her follow-up in 6 months for her other GI conditions and at that point we can schedule her colonoscopy which will be due at that time.

## 2019-04-26 NOTE — Assessment & Plan Note (Signed)
Constipation remains well managed on Linzess.  She has a bowel movement once a day.  Occasional straining required but not frequently.  Call for any worsening symptoms and follow-up in 6 months.

## 2019-04-26 NOTE — Progress Notes (Signed)
Referring Provider: Raiford Simmonds., PA-C Primary Care Physician:  Raiford Simmonds., PA-C Primary GI:  Dr. Gala Romney  Chief Complaint  Patient presents with  . Gastroesophageal Reflux    doing ok    HPI:   Sabrina Perez is a 28 y.o. female who presents for follow-up on GERD and anemia.  The patient was last seen in our office 02/22/2019 for constipation, GERD, non-intractable nausea.  The most recent visit was a virtual office visit due to COVID-19/coronavirus pandemic.  Colonoscopy up-to-date 2018 with recommended 3-year repeat in 2021.  Historically constipation controlled on Linzess.  Colonoscopy in 2018 did find a 13 mm polyp found to be tubulovillous adenoma.  Previously with some intermittent scant toilet tissue hematochezia with straining and constipation if she runs out of Linzess.  At her last visit she notes a bowel movement every 1 to 2 days which are consistent with [facility] 4, intermittent mild lower abdominal discomfort that resolves after bowel movement.  Satisfied with her current bowel movements.  Recent worsening of GERD previously managed with dietary changes and has only been taking Tums.  Also notes "out of nowhere" nausea and vomiting.  No other overt GI symptoms.  Recommended continue Linzess, start Prilosec 40 mg daily, follow-up in 2 months.  Recent CBCs showed no anemia.  Her last CBC in our system with a hemoglobin of 14.3.  Today she states she's doing well overall. GERD is well managed on current regimen. Had to stop her GERD medications for 1 week with antibiotics for a spider bite and had a lot of GERD symptoms. Since being back on her medications she's doing good again. No further breakthrough. Denies abdominal pain unless she has to have a bowel movement, pain resolves after bowel movement. Has a bowel movement about every day, typically complete emptying, typically Bristol 4. Stools have been dark almost black. Has had some diet changes for weight loss.  Denies N/V, hematochezia, fever, chills, unintentional weight loss. Has had more migraines lately and plans to call her PCP about this. Denies URI or flu-like symptoms. Denies loss of sense of taste or smell. Denies chest pain, dyspnea, dizziness, lightheadedness, syncope, near syncope. Denies any other upper or lower GI symptoms.  Past Medical History:  Diagnosis Date  . Asthma   . Environmental and seasonal allergies   . GERD (gastroesophageal reflux disease)     Past Surgical History:  Procedure Laterality Date  . COLONOSCOPY WITH PROPOFOL N/A 10/29/2016   Procedure: COLONOSCOPY WITH PROPOFOL;  Surgeon: Daneil Dolin, MD;  [facility]: [facility];  Service: Endoscopy;  Laterality: N/A;  1230   . POLYPECTOMY  10/29/2016   Procedure: POLYPECTOMY;  Surgeon: Daneil Dolin, MD;  [facility];  Service: Endoscopy;;  colon  . WISDOM TOOTH EXTRACTION      Current Outpatient Medications  Medication Sig Dispense Refill  . albuterol (PROVENTIL HFA;VENTOLIN HFA) 108 (90 BASE) MCG/ACT inhaler Inhale 1-2 puffs into the lungs every 6 (six) hours as needed for wheezing or shortness of breath. 1 Inhaler 0  . beclomethasone (QVAR) 40 MCG/ACT inhaler Inhale 1 puff into the lungs 2 (two) times daily.    Marland Kitchen linaclotide (LINZESS) 145 MCG CAPS capsule Take 1 capsule (145 mcg total) by mouth daily before breakfast. 90 capsule 3  . loratadine (CLARITIN) 10 MG tablet Take 10 mg by mouth daily as needed for allergies.     . medroxyPROGESTERone Acetate (DEPO-PROVERA IM) Inject into the muscle. Every 3 months    .  omeprazole (PRILOSEC) 40 MG capsule Take 1 capsule (40 mg total) by mouth daily. 90 capsule 3   No current facility-administered medications for this visit.     Allergies as of 04/26/2019 - Review Complete 04/26/2019  Allergen Reaction Noted  . Motrin [ibuprofen] Palpitations 11/06/2012    Family History  Problem Relation Age of Onset  . Anxiety disorder Mother   . Drug abuse Father    . Alcohol abuse Maternal Uncle   . Alcohol abuse Paternal Uncle   . Drug abuse Paternal Uncle   . Anxiety disorder Paternal Aunt   . Depression Paternal Aunt   . Colon polyps Paternal Aunt   . Colon cancer Neg Hx     Social History   Socioeconomic History  . Marital status: Single    Spouse name: Not on file  . Number of children: Not on file  . Years of education: Not on file  . Highest education level: Not on file  Occupational History  . Not on file  Social Needs  . Financial resource strain: Not on file  . Food insecurity    Worry: Not on file    Inability: Not on file  . Transportation needs    Medical: Not on file    Non-medical: Not on file  Tobacco Use  . Smoking status: Current Every Day Smoker    Packs/day: 1.00    Years: 4.00    Pack years: 4.00    Types: Cigarettes  . Smokeless tobacco: Never Used  Substance and Sexual Activity  . Alcohol use: Yes    Comment: wine once a week  . Drug use: Yes    Frequency: 7.0 times per week    Types: Marijuana  . Sexual activity: Never    Birth control/protection: Injection  Lifestyle  . Physical activity    Days per week: Not on file    Minutes per session: Not on file  . Stress: Not on file  Relationships  . Social Herbalist on phone: Not on file    Gets together: Not on file    Attends religious service: Not on file    Active member of club or organization: Not on file    Attends meetings of clubs or organizations: Not on file    Relationship status: Not on file  Other Topics Concern  . Not on file  Social History Narrative  . Not on file    Review of Systems: General: Negative for anorexia, weight loss, fever, chills, fatigue, weakness. ENT: Negative for hoarseness, difficulty swallowing. CV: Negative for chest pain, angina, palpitations, peripheral edema.  Respiratory: Negative for dyspnea at rest, cough, sputum, wheezing.  GI: See history of present illness. Endo: Negative for unusual  weight change.  Heme: Negative for bruising or bleeding. Allergy: Negative for rash or hives.   Physical Exam: BP 130/84   Pulse 94   Temp (!) 96.8 F (36 C) (Temporal)   Ht 5\' 2"  (1.575 m)   Wt 234 lb 9.6 oz (106.4 kg)   LMP 02/15/2019   BMI 42.91 kg/m  General:   Alert and oriented. Pleasant and cooperative. Well-nourished and well-developed.  Eyes:  Without icterus, sclera clear and conjunctiva pink.  Ears:  Normal auditory acuity. Cardiovascular:  S1, S2 present without murmurs appreciated. Extremities without clubbing or edema. Respiratory:  Clear to auscultation bilaterally. No wheezes, rales, or rhonchi. No distress.  Gastrointestinal:  +BS, soft, non-tender and non-distended. No HSM noted. No guarding or rebound.  No masses appreciated.  Rectal:  Deferred  Musculoskalatal:  Symmetrical without gross deformities. Neurologic:  Alert and oriented x4;  grossly normal neurologically. Psych:  Alert and cooperative. Normal mood and affect. Heme/Lymph/Immune: No excessive bruising noted.    04/26/2019 9:24 AM   Disclaimer: This note was dictated with voice recognition software. Similar sounding words can inadvertently be transcribed and may not be corrected upon review.

## 2019-04-26 NOTE — Patient Instructions (Signed)
Your health issues we discussed today were:   GERD (reflux/heartburn): 1. Continue your excellent efforts at weight loss!  Congrats on the weight you have lost so far! 2. Continue taking your Prilosec (omeprazole) acid blocker daily 3. Call us if you have any worsening or severe GERD symptoms  Constipation: 1. Continue taking Linzess 2. Call us if you have any worsening constipation 3. You can add MiraLAX as needed for any days that you have hard stools with straining  Previous anemia and rectal bleeding: 1. Have your blood test done when you are able to 2. Only see you back in follow-up we will schedule your colonoscopy at that time 3. Call us if you have any problems or note any obvious significant bleeding  Overall I recommend:  1. Continue your other current medications 2. Call us if you have any questions or concerns 3. Return for follow-up in 6 months.   Because of recent events of COVID-19 ("Coronavirus"), follow CDC recommendations:  1. Wash your hand frequently 2. Avoid touching your face 3. Stay away from people who are sick 4. If you have symptoms such as fever, cough, shortness of breath then call your healthcare provider for further guidance 5. If you are sick, STAY AT HOME unless otherwise directed by your healthcare provider. 6. Follow directions from state and national officials regarding staying safe   At [facility] we value your feedback. You may receive a survey about your visit today. Please share your experience as we strive to create trusting relationships with our patients to provide genuine, compassionate, quality care.  We appreciate your understanding and patience as we review any laboratory studies, imaging, and other diagnostic tests that are ordered as we care for you. Our office policy is 5 business days for review of these results, and any emergent or urgent results are addressed in a timely manner for your best interest. If you do  not hear from our office in 1 week, please contact us.   We also encourage the use of MyChart, which contains your medical information for your review as well. If you are not enrolled in this feature, an access code is on this after visit summary for your convenience. Thank you for allowing [place] to be involved in your care.  It was great to see you today!  I hope you have a Merry Christmas and Happy New Year!!

## 2019-04-26 NOTE — Assessment & Plan Note (Signed)
GERD symptoms currently doing well on PPI.  Is having intentional weight loss.  I discussed the role of excessive abdominal girth and worsening GERD symptoms.  I congratulated her on her ongoing weight loss and encouraged her to continue.  At this point continue PPI, continue efforts toward weight loss.  Follow-up in 6 months.

## 2019-05-04 LAB — CBC WITH DIFFERENTIAL/PLATELET
Absolute Monocytes: 639 cells/uL (ref 200–950)
Basophils Absolute: 27 cells/uL (ref 0–200)
Basophils Relative: 0.3 %
Eosinophils Absolute: 207 cells/uL (ref 15–500)
Eosinophils Relative: 2.3 %
HCT: 42.5 % (ref 35.0–45.0)
Hemoglobin: 14.4 g/dL (ref 11.7–15.5)
Lymphs Abs: 2556 cells/uL (ref 850–3900)
MCH: 33.8 pg — ABNORMAL HIGH (ref 27.0–33.0)
MCHC: 33.9 g/dL (ref 32.0–36.0)
MCV: 99.8 fL (ref 80.0–100.0)
MPV: 8.9 fL (ref 7.5–12.5)
Monocytes Relative: 7.1 %
Neutro Abs: 5571 cells/uL (ref 1500–7800)
Neutrophils Relative %: 61.9 %
Platelets: 397 10*3/uL (ref 140–400)
RBC: 4.26 10*6/uL (ref 3.80–5.10)
RDW: 12.1 % (ref 11.0–15.0)
Total Lymphocyte: 28.4 %
WBC: 9 10*3/uL (ref 3.8–10.8)

## 2019-10-25 ENCOUNTER — Ambulatory Visit: Payer: Medicaid Other | Admitting: Nurse Practitioner

## 2019-10-25 ENCOUNTER — Other Ambulatory Visit: Payer: Self-pay

## 2019-10-25 ENCOUNTER — Encounter: Payer: Self-pay | Admitting: Nurse Practitioner

## 2019-10-25 VITALS — BP 141/85 | HR 81 | Temp 97.7°F | Ht 62.0 in | Wt 240.2 lb

## 2019-10-25 DIAGNOSIS — K5900 Constipation, unspecified: Secondary | ICD-10-CM

## 2019-10-25 DIAGNOSIS — D126 Benign neoplasm of colon, unspecified: Secondary | ICD-10-CM | POA: Insufficient documentation

## 2019-10-25 DIAGNOSIS — K219 Gastro-esophageal reflux disease without esophagitis: Secondary | ICD-10-CM | POA: Diagnosis not present

## 2019-10-25 MED ORDER — OMEPRAZOLE 40 MG PO CPDR: 40.0000 mg | DELAYED_RELEASE_CAPSULE | Freq: Two times a day (BID) | ORAL | 5 refills | Status: DC

## 2019-10-25 NOTE — Progress Notes (Signed)
Referring Provider: Raiford Simmonds., PA-C Primary Care Physician:  Raiford Simmonds., PA-C Primary GI:  Dr. Gala Romney  Chief Complaint  Patient presents with  . Emesis    occurs after eating unless drinks sprite/sierra mist    HPI:   Sabrina Perez is a 29 y.o. female who presents for follow-up on GERD and constipation as well to schedule colonoscopy (due for 3-year repeat).  The patient was last seen in our office 04/26/2019 for GERD and constipation as well as anemia.  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  Recommended repeat colonoscopy in 2021 (3 years).  More recent CBCs with no persistent anemia.  History of chronic GERD.  At her last visit she noted GERD well managed on current regimen but did have significant flares when she had to stop her medications for antibiotics due to a spider bite.  This improved when she restarted her PPI.  Bowel movement daily with complete emptying and [place] 4.  Some darker stools, however.  Some diet changes in order to promote weight loss.  No other overt GI complaints.  Recommended continued efforts at weight loss, continue Prilosec, continue Linzess, can add MiraLAX as needed, update labs, follow-up in 6 months at which point we would discuss repeating colonoscopy.  CBC completed 05/04/2019 found persistent normal hemoglobin at 14.4.  Today she states she's doing ok overall. Constipation is doing well on Linzess. Sometimes missing her PPI. Has been having some nausea in the morning with post-prandial vomiting. Although, hasn't been very good with PPI. Also having some reflux with that as well. GERD symptoms typically at night. Denies abdominal pain, hematochezia, melena, fever, chills, unintentional weight loss. Denies URI or flu-like symptoms. Denies loss of sense of taste or smell.  The patient has received COVID-19 vaccination(s). Denies chest pain, dyspnea, dizziness, lightheadedness, syncope, near syncope. Denies any other upper or lower GI symptoms.  Past Medical History:  Diagnosis Date  . Asthma   . Environmental and seasonal allergies   . GERD (gastroesophageal reflux disease)     Past Surgical History:  Procedure Laterality Date  . COLONOSCOPY WITH PROPOFOL N/A 10/29/2016   Procedure: COLONOSCOPY WITH PROPOFOL;  Surgeon: Daneil Dolin, MD;  [facility]: [facility];  Service: Endoscopy;  Laterality: N/A;  1230   . POLYPECTOMY  10/29/2016   Procedure: POLYPECTOMY;  Surgeon: Daneil Dolin, MD;  Location: [facility];  Service: Endoscopy;;  colon  . WISDOM TOOTH EXTRACTION      Current Outpatient Medications  Medication Sig Dispense Refill  . albuterol (PROVENTIL HFA;VENTOLIN HFA) 108 (90 BASE) MCG/ACT inhaler Inhale 1-2 puffs into the lungs every 6 (six) hours as needed for wheezing or shortness of breath. 1 Inhaler 0  . beclomethasone (QVAR) 40 MCG/ACT inhaler Inhale 1 puff into the lungs 2 (two) times daily.    Marland Kitchen linaclotide (LINZESS) 145 MCG CAPS capsule Take 1 capsule (145 mcg total) by mouth daily before breakfast. 90 capsule 3  . loratadine (CLARITIN) 10 MG tablet Take 10 mg by mouth daily as needed for allergies.     . medroxyPROGESTERone Acetate (DEPO-PROVERA IM) Inject into the muscle. Every 3 months    . omeprazole (PRILOSEC) 40 MG capsule Take 1 capsule (40 mg total) by mouth daily. 90 capsule 3   No current facility-administered medications for this visit.  Allergies as of 10/25/2019 - Review Complete 10/25/2019  Allergen Reaction Noted  . Motrin [ibuprofen] Palpitations 11/06/2012    Family History  Problem Relation Age of Onset  . Anxiety disorder Mother   . Drug abuse Father   . Alcohol abuse Maternal Uncle   . Alcohol abuse Paternal Uncle   . Drug abuse Paternal Uncle   . Anxiety disorder Paternal Aunt   . Depression Paternal  Aunt   . Colon polyps Paternal Aunt   . Colon cancer Neg Hx     Social History   Socioeconomic History  . Marital status: Single    Spouse name: Not on file  . Number of children: Not on file  . Years of education: Not on file  . Highest education level: Not on file  Occupational History  . Not on file  Tobacco Use  . Smoking status: Current Every Day Smoker    Packs/day: 1.00    Years: 4.00    Pack years: 4.00    Types: Cigarettes  . Smokeless tobacco: Never Used  Substance and Sexual Activity  . Alcohol use: Yes    Comment: wine once a week  . Drug use: Yes    Frequency: 7.0 times per week    Types: Marijuana  . Sexual activity: Never    Birth control/protection: Injection  Other Topics Concern  . Not on file  Social History Narrative  . Not on file   Social Determinants of Health   Financial Resource Strain:   . Difficulty of Paying Living Expenses:   Food Insecurity:   . Worried About Charity fundraiser in the Last Year:   . Arboriculturist in the Last Year:   Transportation Needs:   . Film/video editor (Medical):   Marland Kitchen Lack of Transportation (Non-Medical):   Physical Activity:   . Days of Exercise per Week:   . Minutes of Exercise per Session:   Stress:   . Feeling of Stress :   Social Connections:   . Frequency of Communication with Friends and Family:   . Frequency of Social Gatherings with Friends and Family:   . Attends Religious Services:   . Active Member of Clubs or Organizations:   . Attends Archivist Meetings:   Marland Kitchen Marital Status:     Subjective: Review of Systems  Constitutional: Negative for chills, fever, malaise/fatigue and weight loss.  HENT: Negative for congestion and sore throat.   Respiratory: Negative for cough and shortness of breath.   Cardiovascular: Negative for chest pain and palpitations.  Gastrointestinal: Positive for constipation, diarrhea, nausea (not taking meds as prescribed) and vomiting. Negative for  abdominal pain, blood in stool and melena.       Intermittent diarrhea/constipation not taking meds as prescribed  Musculoskeletal: Negative for joint pain and myalgias.  Skin: Negative for rash.  Neurological: Negative for dizziness and weakness.  Endo/Heme/Allergies: Does not bruise/bleed easily.  Psychiatric/Behavioral: Negative for depression. The patient is not nervous/anxious.   All other systems reviewed and are negative.    Objective: BP (!) 141/85   Pulse 81   Temp 97.7 F (36.5 C) (Temporal)   Ht 5\' 2"  (1.575 m)   Wt 240 lb 3.2 oz (109 kg)   LMP 08/31/2019 (Approximate)   BMI 43.93 kg/m  Physical Exam Vitals and nursing note reviewed.  Constitutional:      General: She is not in acute distress.    Appearance: Normal appearance. She is well-developed. She is  obese. She is not ill-appearing, toxic-appearing or diaphoretic.  HENT:     Head: Normocephalic and atraumatic.     Nose: No congestion or rhinorrhea.  Eyes:     General: No scleral icterus. Cardiovascular:     Rate and Rhythm: Normal rate and regular rhythm.     Heart sounds: Normal heart sounds.  Pulmonary:     Effort: Pulmonary effort is normal. No respiratory distress.     Breath sounds: Normal breath sounds.  Abdominal:     General: Bowel sounds are normal.     Palpations: Abdomen is soft. There is no hepatomegaly, splenomegaly or mass.     Tenderness: There is no abdominal tenderness. There is no guarding or rebound.     Hernia: No hernia is present.  Skin:    General: Skin is warm and dry.     Coloration: Skin is not jaundiced.     Findings: No rash.  Neurological:     General: No focal deficit present.     Mental Status: She is alert and oriented to person, place, and time.  Psychiatric:        Attention and Perception: Attention normal.        Mood and Affect: Mood normal.        Speech: Speech normal.        Behavior: Behavior normal.        Thought Content: Thought content normal.         Cognition and Memory: Cognition and memory normal.       10/25/2019 1:39 PM   Disclaimer: This note was dictated with voice recognition software. Similar sounding words can inadvertently be transcribed and may not be corrected upon review.

## 2019-10-25 NOTE — Patient Instructions (Signed)
Your health issues we discussed today were:   GERD (reflux/heartburn): 1. I sent a new prescription to your pharmacy to increase your omeprazole to 40 mg twice a day. 2. Take this for sing in the morning on an empty stomach and then take it 30 minutes before your last meal the day 3. Avoid trigger foods that cause worsening reflux symptoms 4. Try to avoid eating for at least 3 to 4 hours prior to going to bed 5. Call us if you have any worsening or severe symptoms  Constipation: 1. As we discussed, take Linzess 72 mcg once a day, every day, first thing in the morning 2. Call us if you have any worsening or severe symptoms  History of large polyp in your colon: 1. We will schedule your colonoscopy for you 2. Further recommendations will be made based on the results of your colonoscopy  Overall I recommend:  1. Continue your other current medications 2. Return for follow-up in 6 months 3. Call us if you have any questions or concerns.   ---------------------------------------------------------------  I am glad you have gotten your COVID-19 vaccination!  Even though you are fully vaccinated you should continue to follow CDC and state/local guidelines.  ---------------------------------------------------------------   At [facility] we value your feedback. You may receive a survey about your visit today. Please share your experience as we strive to create trusting relationships with our patients to provide genuine, compassionate, quality care.  We appreciate your understanding and patience as we review any laboratory studies, imaging, and other diagnostic tests that are ordered as we care for you. Our office policy is 5 business days for review of these results, and any emergent or urgent results are addressed in a timely manner for your best interest. If you do not hear from our office in 1 week, please contact us.   We also encourage the use of MyChart, which contains  your medical information for your review as well. If you are not enrolled in this feature, an access code is on this after visit summary for your convenience. Thank you for allowing [place] to be involved in your care.  It was great to see you today!  I hope you have a great Summer!!

## 2019-10-25 NOTE — Assessment & Plan Note (Signed)
The patient has a history of chronic constipation and Linzess typically works pretty well for her.  She has not been taking it as recommended and subsequently has had some alternating constipation with diarrhea.  It sounds as though when she misses her medication she has constipation and in which she takes an initial dose she will have diarrhea.  I recommended she take her medication Linzess 72 mcg once daily, pursing in the morning, on a daily basis regardless to help promote regular bowel movements.  Call for any worsening or severe symptoms.  Follow-up in 6 months otherwise.

## 2019-10-25 NOTE — Assessment & Plan Note (Signed)
GERD symptoms not adequately controlled, although she admits she does not take her medications diligently.  She is currently on omeprazole 40 mg daily.  She does have some GERD flares but only in the evenings.  Otherwise for morning time symptoms include nausea and occasionally postprandial vomiting.  At this point I will increase her PPI to twice daily to see if this produces any better effect.  We can also consider switching agents in the future if no improvement.  No need for upper endoscopy at this time.  Follow-up in 6 months.

## 2019-10-25 NOTE — Assessment & Plan Note (Addendum)
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  Recommended 3-year repeat.  Currently doing well for the most part from a GI standpoint other than some bowel abnormalities while she admits she is not taking her medications as prescribed.  Further recommendations above.  At this point we will proceed with a colonoscopy as previously recommended.  Further recommendations to follow.  Proceed with TCS on propofol/MAC with Dr. Gala Romney in near future: the risks, benefits, and alternatives have been discussed with the patient in detail. The patient states understanding and desires to proceed.  The patient is not on any anticoagulants, anxiolytics, chronic pain medications, antidepressants, antidiabetics, or iron supplements.  She does admit to smoking marijuana on a daily basis.  We will plan for the procedure on propofol/MAC to promote adequate sedation, as was done for her last colonoscopy.  ASA III (BMI >40)

## 2019-12-04 ENCOUNTER — Telehealth: Payer: Self-pay | Admitting: *Deleted

## 2019-12-04 MED ORDER — CLENPIQ 10-3.5-12 MG-GM -GM/160ML PO SOLN: 1.0000 | Freq: Once | ORAL | 0 refills | Status: AC

## 2019-12-04 NOTE — Telephone Encounter (Signed)
Schedule TCS with propofol with RMR.   Called pt. She is scheduled for 01/22/2020 ar 2:15pm. Patient aware will need pre-op/covid test prior. Advised will mail instructions to her with this appt. Confirmed pharmacy [facility]

## 2019-12-04 NOTE — Telephone Encounter (Signed)
PA submitted via [facility] website. The notification/prior authorization reference number is [id number].

## 2019-12-05 ENCOUNTER — Encounter: Payer: Self-pay | Admitting: *Deleted

## 2019-12-05 NOTE — Telephone Encounter (Signed)
Received approval from insurance. Auth# [id number]  dates 01/22/2020-03/24/2020

## 2020-01-18 NOTE — Patient Instructions (Signed)
[facility]  01/18/2020     @PREFPERIOPPHARMACY @   Your procedure is scheduled on  01/22/2020.  Report to [name] at  1245  A.M.  Call this number if you have problems the morning of surgery:  [phone]   Remember:  Follow the diet and prep instructions given to you by the office.                    Take these medicines the morning of surgery with A SIP OF WATER  None    Do not wear jewelry, make-up or nail polish.  Do not wear lotions, powders, or perfumes. Please wear deodorant and brush your teeth.  Do not shave 48 hours prior to surgery.  Men may shave face and neck.  Do not bring valuables to the hospital.  [facility] is not responsible for any belongings or valuables.  Contacts, dentures or bridgework may not be worn into surgery.  Leave your suitcase in the car.  After surgery it may be brought to your room.  For patients admitted to the hospital, discharge time will be determined by your treatment team.  Patients discharged the day of surgery will not be allowed to drive home.   Name and phone number of your driver:   family Special instructions:  DO NOT smoke the morning of your procedure.  Please read over the following fact sheets that you were given. Anesthesia Post-op Instructions and Care and Recovery After Surgery       Colonoscopy, Adult, Care After This sheet gives you information about how to care for yourself after your procedure. Your health care provider may also give you more specific instructions. If you have problems or questions, contact your health care provider. What can I expect after the procedure? After the procedure, it is common to have:  A small amount of blood in your stool for 24 hours after the procedure.  Some gas.  Mild cramping or bloating of your abdomen. Follow these instructions at home: Eating and drinking   Drink enough fluid to keep your urine pale yellow.  Follow instructions from your health  care provider about eating or drinking restrictions.  Resume your normal diet as instructed by your health care provider. Avoid heavy or fried foods that are hard to digest. Activity  Rest as told by your health care provider.  Avoid sitting for a long time without moving. Get up to take short walks every 1-2 hours. This is important to improve blood flow and breathing. Ask for help if you feel weak or unsteady.  Return to your normal activities as told by your health care provider. Ask your health care provider what activities are safe for you. Managing cramping and bloating   Try walking around when you have cramps or feel bloated.  Apply heat to your abdomen as told by your health care provider. Use the heat source that your health care provider recommends, such as a moist heat pack or a heating pad. ? Place a towel between your skin and the heat source. ? Leave the heat on for 20-30 minutes. ? Remove the heat if your skin turns bright red. This is especially important if you are unable to feel pain, heat, or cold. You may have a greater risk of getting burned. General instructions  For the first 24 hours after the procedure: ? Do not drive or use machinery. ? Do not sign important documents. ? Do  not drink alcohol. ? Do your regular daily activities at a slower pace than normal. ? Eat soft foods that are easy to digest.  Take over-the-counter and prescription medicines only as told by your health care provider.  Keep all follow-up visits as told by your health care provider. This is important. Contact a health care provider if:  You have blood in your stool 2-3 days after the procedure. Get help right away if you have:  More than a small spotting of blood in your stool.  Large blood clots in your stool.  Swelling of your abdomen.  Nausea or vomiting.  A fever.  Increasing pain in your abdomen that is not relieved with medicine. Summary  After the procedure, it is  common to have a small amount of blood in your stool. You may also have mild cramping and bloating of your abdomen.  For the first 24 hours after the procedure, do not drive or use machinery, sign important documents, or drink alcohol.  Get help right away if you have a lot of blood in your stool, nausea or vomiting, a fever, or increased pain in your abdomen. This information is not intended to replace advice given to you by your health care provider. Make sure you discuss any questions you have with your health care provider. Document Revised: 12/05/2018 Document Reviewed: 12/05/2018 Elsevier Patient Education  [place] After These instructions provide you with information about caring for yourself after your procedure. Your health care provider may also give you more specific instructions. Your treatment has been planned according to current medical practices, but problems sometimes occur. Call your health care provider if you have any problems or questions after your procedure. What can I expect after the procedure? After your procedure, you may:  Feel sleepy for several hours.  Feel clumsy and have poor balance for several hours.  Feel forgetful about what happened after the procedure.  Have poor judgment for several hours.  Feel nauseous or vomit.  Have a sore throat if you had a breathing tube during the procedure. Follow these instructions at home: For at least 24 hours after the procedure:      Have a responsible adult stay with you. It is important to have someone help care for you until you are awake and alert.  Rest as needed.  Do not: ? Participate in activities in which you could fall or become injured. ? Drive. ? Use heavy machinery. ? Drink alcohol. ? Take sleeping pills or medicines that cause drowsiness. ? Make important decisions or sign legal documents. ? Take care of children on your own. Eating and  drinking  Follow the diet that is recommended by your health care provider.  If you vomit, drink water, juice, or soup when you can drink without vomiting.  Make sure you have little or no nausea before eating solid foods. General instructions  Take over-the-counter and prescription medicines only as told by your health care provider.  If you have sleep apnea, surgery and certain medicines can increase your risk for breathing problems. Follow instructions from your health care provider about wearing your sleep device: ? Anytime you are sleeping, including during daytime naps. ? While taking prescription pain medicines, sleeping medicines, or medicines that make you drowsy.  If you smoke, do not smoke without supervision.  Keep all follow-up visits as told by your health care provider. This is important. Contact a health care provider if:  You keep feeling  nauseous or you keep vomiting.  You feel light-headed.  You develop a rash.  You have a fever. Get help right away if:  You have trouble breathing. Summary  For several hours after your procedure, you may feel sleepy and have poor judgment.  Have a responsible adult stay with you for at least 24 hours or until you are awake and alert. This information is not intended to replace advice given to you by your health care provider. Make sure you discuss any questions you have with your health care provider. Document Revised: 08/09/2017 Document Reviewed: 09/01/2015 Elsevier Patient Education  [place].

## 2020-01-19 ENCOUNTER — Encounter (HOSPITAL_COMMUNITY)
Admission: RE | Admit: 2020-01-19 | Discharge: 2020-01-19 | Disposition: A | Discharge status: Home / Self Care | Payer: Medicaid Other | Source: Ambulatory Visit | Attending: Internal Medicine | Admitting: Internal Medicine

## 2020-01-19 ENCOUNTER — Other Ambulatory Visit: Payer: Self-pay

## 2020-01-19 ENCOUNTER — Other Ambulatory Visit (HOSPITAL_COMMUNITY)
Admission: RE | Admit: 2020-01-19 | Discharge: 2020-01-19 | Disposition: A | Discharge status: Home / Self Care | Payer: Medicaid Other | Source: Ambulatory Visit | Attending: Internal Medicine | Admitting: Internal Medicine

## 2020-01-19 ENCOUNTER — Encounter (HOSPITAL_COMMUNITY): Payer: Self-pay

## 2020-01-19 DIAGNOSIS — Z01812 Encounter for preprocedural laboratory examination: Secondary | ICD-10-CM | POA: Diagnosis present

## 2020-01-19 DIAGNOSIS — Z20822 Contact with and (suspected) exposure to covid-19: Secondary | ICD-10-CM | POA: Diagnosis not present

## 2020-01-19 LAB — PREGNANCY, URINE: Preg Test, Ur: NEGATIVE

## 2020-01-19 LAB — SARS CORONAVIRUS 2 (TAT 6-24 HRS): SARS Coronavirus 2: NEGATIVE

## 2020-01-22 ENCOUNTER — Ambulatory Visit (HOSPITAL_COMMUNITY): Payer: Medicaid Other | Admitting: Anesthesiology

## 2020-01-22 ENCOUNTER — Encounter (HOSPITAL_COMMUNITY)
Admission: RE | Disposition: A | Discharge status: Home / Self Care | Payer: Self-pay | Source: Home / Self Care | Attending: Internal Medicine

## 2020-01-22 ENCOUNTER — Encounter (HOSPITAL_COMMUNITY): Payer: Self-pay | Admitting: Internal Medicine

## 2020-01-22 ENCOUNTER — Ambulatory Visit (HOSPITAL_COMMUNITY)
Admission: RE | Admit: 2020-01-22 | Discharge: 2020-01-22 | Disposition: A | Discharge status: Home / Self Care | Payer: Medicaid Other | Attending: Internal Medicine | Admitting: Internal Medicine

## 2020-01-22 ENCOUNTER — Other Ambulatory Visit: Payer: Self-pay

## 2020-01-22 DIAGNOSIS — K219 Gastro-esophageal reflux disease without esophagitis: Secondary | ICD-10-CM | POA: Insufficient documentation

## 2020-01-22 DIAGNOSIS — Z7289 Other problems related to lifestyle: Secondary | ICD-10-CM | POA: Insufficient documentation

## 2020-01-22 DIAGNOSIS — F1290 Cannabis use, unspecified, uncomplicated: Secondary | ICD-10-CM | POA: Diagnosis not present

## 2020-01-22 DIAGNOSIS — Z86010 Personal history of colonic polyps: Secondary | ICD-10-CM | POA: Insufficient documentation

## 2020-01-22 DIAGNOSIS — Z79899 Other long term (current) drug therapy: Secondary | ICD-10-CM | POA: Diagnosis not present

## 2020-01-22 DIAGNOSIS — F419 Anxiety disorder, unspecified: Secondary | ICD-10-CM | POA: Insufficient documentation

## 2020-01-22 DIAGNOSIS — F17210 Nicotine dependence, cigarettes, uncomplicated: Secondary | ICD-10-CM | POA: Insufficient documentation

## 2020-01-22 DIAGNOSIS — D649 Anemia, unspecified: Secondary | ICD-10-CM | POA: Diagnosis not present

## 2020-01-22 DIAGNOSIS — J45909 Unspecified asthma, uncomplicated: Secondary | ICD-10-CM | POA: Diagnosis not present

## 2020-01-22 DIAGNOSIS — Z1211 Encounter for screening for malignant neoplasm of colon: Secondary | ICD-10-CM | POA: Diagnosis not present

## 2020-01-22 HISTORY — PX: COLONOSCOPY WITH PROPOFOL: SHX5780

## 2020-01-22 SURGERY — COLONOSCOPY WITH PROPOFOL
Anesthesia: General

## 2020-01-22 MED ORDER — LACTATED RINGERS IV SOLN
Freq: Once | INTRAVENOUS | Status: AC
  Administered 2020-01-22: 1000 mL via INTRAVENOUS

## 2020-01-22 MED ORDER — CHLORHEXIDINE GLUCONATE CLOTH 2 % EX PADS: 6.0000 | MEDICATED_PAD | Freq: Once | CUTANEOUS | Status: DC

## 2020-01-22 MED ORDER — LACTATED RINGERS IV SOLN: INTRAVENOUS | Status: DC | PRN

## 2020-01-22 MED ORDER — PROPOFOL 500 MG/50ML IV EMUL
INTRAVENOUS | Status: DC | PRN
  Administered 2020-01-22: 150 ug/kg/min via INTRAVENOUS

## 2020-01-22 MED ORDER — PROPOFOL 10 MG/ML IV BOLUS
INTRAVENOUS | Status: DC | PRN
  Administered 2020-01-22: 40 mg via INTRAVENOUS
  Administered 2020-01-22: 20 mg via INTRAVENOUS
  Administered 2020-01-22: 30 mg via INTRAVENOUS
  Administered 2020-01-22: 50 mg via INTRAVENOUS

## 2020-01-22 MED ORDER — STERILE WATER FOR IRRIGATION IR SOLN
Status: DC | PRN
  Administered 2020-01-22: 1.5 mL

## 2020-01-22 MED ORDER — PROPOFOL 10 MG/ML IV BOLUS
INTRAVENOUS | Status: AC
  Filled 2020-01-22: qty 40

## 2020-01-22 NOTE — Discharge Instructions (Signed)
  Colonoscopy Discharge Instructions  Read the instructions outlined below and refer to this sheet in the next few weeks. These discharge instructions provide you with general information on caring for yourself after you leave the hospital. Your doctor may also give you specific instructions. While your treatment has been planned according to the most current medical practices available, unavoidable complications occasionally occur. If you have any problems or questions after discharge, call Dr. Gala Romney at [phone]. ACTIVITY  You may resume your regular activity, but move at a slower pace for the next 24 hours.   Take frequent rest periods for the next 24 hours.   Walking will help get rid of the air and reduce the bloated feeling in your belly (abdomen).   No driving for 24 hours (because of the medicine (anesthesia) used during the test).    Do not sign any important legal documents or operate any machinery for 24 hours (because of the anesthesia used during the test).  NUTRITION  Drink plenty of fluids.   You may resume your normal diet as instructed by your doctor.   Begin with a light meal and progress to your normal diet. Heavy or fried foods are harder to digest and may make you feel sick to your stomach (nauseated).   Avoid alcoholic beverages for 24 hours or as instructed.  MEDICATIONS  You may resume your normal medications unless your doctor tells you otherwise.  WHAT YOU CAN EXPECT TODAY  Some feelings of bloating in the abdomen.   Passage of more gas than usual.   Spotting of blood in your stool or on the toilet paper.  IF YOU HAD POLYPS REMOVED DURING THE COLONOSCOPY:  No aspirin products for 7 days or as instructed.   No alcohol for 7 days or as instructed.   Eat a soft diet for the next 24 hours.  FINDING OUT THE RESULTS OF YOUR TEST Not all test results are available during your visit. If your test results are not back during the visit, make an appointment  with your caregiver to find out the results. Do not assume everything is normal if you have not heard from your caregiver or the medical facility. It is important for you to follow up on all of your test results.  SEEK IMMEDIATE MEDICAL ATTENTION IF:  You have more than a spotting of blood in your stool.   Your belly is swollen (abdominal distention).   You are nauseated or vomiting.   You have a temperature over 101.   You have abdominal pain or discomfort that is severe or gets worse throughout the day.    No polyps found today  Recommend repeat colonoscopy in 5 years  At patient request, I called Fredrich Birks at [phone] -reviewed results  I discussed the importance of screening first-degree relatives for polyps.

## 2020-01-22 NOTE — Transfer of Care (Signed)
Immediate Anesthesia Transfer of Care Note  Patient: Sabrina Perez  Procedure(s) Performed: COLONOSCOPY WITH PROPOFOL (N/A )  Patient Location: PACU  Anesthesia Type:General  Level of Consciousness: awake  Airway & Oxygen Therapy: Patient Spontanous Breathing  Post-op Assessment: Report given to RN  Post vital signs: Reviewed  Last Vitals:  Vitals Value Taken Time  BP 137/111 01/22/20 1433  Temp    Pulse 72 01/22/20 1434  Resp 15 01/22/20 1434  SpO2 98 % 01/22/20 1434  Vitals shown include unvalidated device data.  Last Pain:  Vitals:   01/22/20 1412  TempSrc:   PainSc: 0-No pain      Patients Stated Pain Goal: 8 (45/99/77 4142)  Complications: No complications documented.

## 2020-01-22 NOTE — H&P (Signed)
@LOGO @   Primary Care Physician:  Raiford Simmonds., PA-C Primary Gastroenterologist:  Dr. Gala Romney  Pre-Procedure History & Physical: HPI:  Sabrina Perez is a 29 y.o. female here for surveillance colonoscopy.  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  Past Medical History:  Diagnosis Date  . Asthma   . Environmental and seasonal allergies   . GERD (gastroesophageal reflux disease)     Past Surgical History:  Procedure Laterality Date  . COLONOSCOPY WITH PROPOFOL N/A 10/29/2016   Procedure: COLONOSCOPY WITH PROPOFOL;  Surgeon: Daneil Dolin, MD;  Location: [facility];  Service: Endoscopy;  Laterality: N/A;  1230   . POLYPECTOMY  10/29/2016   Procedure: POLYPECTOMY;  Surgeon: Daneil Dolin, MD;  Location: [facility];  Service: Endoscopy;;  colon  . WISDOM TOOTH EXTRACTION      Prior to Admission medications   Medication Sig Start Date End Date Taking? Authorizing Provider  omeprazole (PRILOSEC) 40 MG capsule Take 1 capsule (40 mg total) by mouth in the morning and at bedtime. Patient not taking: Reported on 01/16/2020 10/25/19   Carlis Stable, NP    Allergies as of 12/04/2019 - Review Complete 10/25/2019  Allergen Reaction Noted  . Motrin [ibuprofen] Palpitations 11/06/2012    Family History  Problem Relation Age of Onset  . Anxiety disorder Mother   . Drug abuse Father   . Alcohol abuse Maternal Uncle   . Alcohol abuse Paternal Uncle   . Drug abuse Paternal Uncle   . Anxiety disorder Paternal Aunt   . Depression Paternal Aunt   . Colon polyps Paternal Aunt   . Colon cancer Neg Hx     Social History   Socioeconomic History  . Marital status: Single    Spouse name: Not on file  . Number of children: Not on file  . Years of education: Not on file  . Highest education level: Not on file  Occupational History  . Not on file  Tobacco Use  . Smoking status: Current Every Day Smoker    Packs/day: 1.00    Years: 4.00    Pack years:  4.00    Types: Cigarettes  . Smokeless tobacco: Never Used  Vaping Use  . Vaping Use: Every day  . Start date: 09/16/2014  Substance and Sexual Activity  . Alcohol use: Yes    Comment: wine once a week  . Drug use: Yes    Frequency: 7.0 times per week    Types: Marijuana  . Sexual activity: Never    Birth control/protection: Injection  Other Topics Concern  . Not on file  Social History Narrative  . Not on file   Social Determinants of Health   Financial Resource Strain:   . Difficulty of Paying Living Expenses: Not on file  Food Insecurity:   . Worried About Charity fundraiser in the Last Year: Not on file  . Ran Out of Food in the Last Year: Not on file  Transportation Needs:   . Lack of Transportation (Medical): Not on file  . Lack of Transportation (Non-Medical): Not on file  Physical Activity:   . Days of Exercise per Week: Not on file  . Minutes of Exercise per Session: Not on file  Stress:   . Feeling of Stress : Not on file  Social Connections:   . Frequency of Communication with Friends and Family: Not on file  . Frequency of Social Gatherings with Friends and Family:  Not on file  . Attends Religious Services: Not on file  . Active Member of Clubs or Organizations: Not on file  . Attends Archivist Meetings: Not on file  . Marital Status: Not on file  Intimate Partner Violence:   . Fear of Current or Ex-Partner: Not on file  . Emotionally Abused: Not on file  . Physically Abused: Not on file  . Sexually Abused: Not on file    Review of Systems: See HPI, otherwise negative ROS  Physical Exam: BP 93/81   Temp 98 F (36.7 C) (Oral)   Resp 12   SpO2 97%  General:   Alert,  Well-developed, well-nourished, pleasant and cooperative in NAD SNeck:  Supple; no masses or thyromegaly. No significant cervical adenopathy. Lungs:  Clear throughout to auscultation.   No wheezes, crackles, or rhonchi. No acute distress. Heart:  Regular rate and rhythm; no  murmurs, clicks, rubs,  or gallops. Abdomen: Non-distended, normal bowel sounds.  Soft and nontender without appreciable mass or hepatosplenomegaly.  Pulses:  Normal pulses noted. Extremities:  Without clubbing or edema.  Impression/Plan: Pleasant 29 year old lady here for surveillance colonoscopy.  History of 11 mm tubulovillous adenoma removed from her sigmoid colon 3 years ago.  I have offered the patient a surveillance colonoscopy today per plan. The risks, benefits, limitations, alternatives and imponderables have been reviewed with the patient. Questions have been answered. All parties are agreeable.      Notice: This dictation was prepared with Dragon dictation along with smaller phrase technology. Any transcriptional errors that result from this process are unintentional and may not be corrected upon review.

## 2020-01-22 NOTE — Anesthesia Postprocedure Evaluation (Signed)
Anesthesia Post Note  Patient: Architect  Procedure(s) Performed: COLONOSCOPY WITH PROPOFOL (N/A )  Patient location during evaluation: PACU Anesthesia Type: General Level of consciousness: awake and alert and oriented Pain management: pain level controlled Vital Signs Assessment: post-procedure vital signs reviewed and stable Respiratory status: spontaneous breathing Cardiovascular status: blood pressure returned to baseline and stable Postop Assessment: no apparent nausea or vomiting Anesthetic complications: no   No complications documented.   Last Vitals:  Vitals:   01/22/20 1259 01/22/20 1430  BP: 93/81   Resp: 12   Temp: 36.7 C 36.7 C  SpO2: 97%     Last Pain:  Vitals:   01/22/20 1412  TempSrc:   PainSc: 0-No pain                 Keeshia Sanderlin

## 2020-01-22 NOTE — Anesthesia Preprocedure Evaluation (Signed)
Anesthesia Evaluation  Patient identified by MRN, date of birth, ID band Patient awake    Reviewed: Allergy & Precautions, NPO status , Patient's Chart, lab work & pertinent test results  History of Anesthesia Complications Negative for: history of anesthetic complications  Airway Mallampati: II  TM Distance: >3 FB Neck ROM: Full    Dental  (+) Dental Advisory Given, Missing   Pulmonary asthma , Current Smoker and Patient abstained from smoking.,    Pulmonary exam normal breath sounds clear to auscultation       Cardiovascular Exercise Tolerance: Good Normal cardiovascular exam Rhythm:Regular Rate:Normal     Neuro/Psych PSYCHIATRIC DISORDERS Anxiety    GI/Hepatic GERD  Medicated and Controlled,(+)     substance abuse  alcohol use and marijuana use,   Endo/Other  negative endocrine ROS  Renal/GU negative Renal ROS  negative genitourinary   Musculoskeletal negative musculoskeletal ROS (+)   Abdominal   Peds  Hematology  (+) anemia ,   Anesthesia Other Findings   Reproductive/Obstetrics negative OB ROS                             Anesthesia Physical Anesthesia Plan  ASA: II  Anesthesia Plan: General   Post-op Pain Management:    Induction: Intravenous  PONV Risk Score and Plan: TIVA  Airway Management Planned: Nasal Cannula and Natural Airway  Additional Equipment:   Intra-op Plan:   Post-operative Plan:   Informed Consent: I have reviewed the patients History and Physical, chart, labs and discussed the procedure including the risks, benefits and alternatives for the proposed anesthesia with the patient or authorized representative who has indicated his/her understanding and acceptance.     Dental advisory given  Plan Discussed with: Surgeon and CRNA  Anesthesia Plan Comments:         Anesthesia Quick Evaluation

## 2020-01-22 NOTE — Op Note (Signed)
[facility] Patient Name: Sabrina Perez Procedure Date: 01/22/2020 2:01 PM MRN: [id number] Date of Birth: 1990/09/24 Attending MD: Norvel Richards , MD CSN: [id number] Age: 29 Admit Type: Outpatient Procedure:                Colonoscopy Indications:              High risk colon cancer surveillance: Personal                            history of colonic polyps Providers:                Norvel Richards, MD, Janeece Riggers, RN, Lambert Mody Referring MD:             Royce Macadamia PA, [place] Medicines:                Propofol per Anesthesia Complications:            No immediate complications. Estimated Blood Loss:     Estimated blood loss: none. Procedure:                Pre-Anesthesia Assessment:                           - Prior to the procedure, a History and Physical                            was performed, and patient medications and                            allergies were reviewed. The patient's tolerance of                            previous anesthesia was also reviewed. The risks                            and benefits of the procedure and the sedation                            options and risks were discussed with the patient.                            All questions were answered, and informed consent                            was obtained. Prior Anticoagulants: The patient has                            taken no previous anticoagulant or antiplatelet                            agents. ASA Grade Assessment: II - A patient with  mild systemic disease. After reviewing the risks                            and benefits, the patient was deemed in                            satisfactory condition to undergo the procedure.                           After obtaining informed consent, the colonoscope                            was passed under direct vision. Throughout the                            procedure, the  patient's blood pressure, pulse, and                            oxygen saturations were monitored continuously. The                            CF-[id number] ([id number]) scope was introduced through                            the anus and advanced to the the cecum, identified                            by appendiceal orifice and ileocecal valve. The                            colonoscopy was performed without difficulty. The                            patient tolerated the procedure well. The quality                            of the bowel preparation was adequate. Scope In: 2:17:41 PM Scope Out: 2:29:01 PM Scope Withdrawal Time: 0 hours 7 minutes 52 seconds  Total Procedure Duration: 0 hours 11 minutes 20 seconds  Findings:      The perianal and digital rectal examinations were normal.      The colon (entire examined portion) appeared normal.      The retroflexed view of the distal rectum and anal verge was normal and       showed no anal or rectal abnormalities. Impression:               - The entire examined colon is normal.                           - The distal rectum and anal verge are normal on                            retroflexion view.                           -  No specimens collected. Moderate Sedation:      Moderate (conscious) sedation was personally administered by an       anesthesia professional. The following parameters were monitored: oxygen       saturation, heart rate, blood pressure, respiratory rate, EKG, adequacy       of pulmonary ventilation, and response to care. Recommendation:           - Patient has a contact number available for                            emergencies. The signs and symptoms of potential                            delayed complications were discussed with the                            patient. Return to normal activities tomorrow.                            Written discharge instructions were provided to the                             patient.                           - Resume previous diet.                           - Continue present medications.                           - Repeat colonoscopy in 5 years for surveillance.                           - Return to GI office (date not yet determined). Procedure Code(s):        --- Professional ---                           [phone], Colonoscopy, flexible; diagnostic, including                            collection of specimen(s) by brushing or washing,                            when performed (separate procedure) Diagnosis Code(s):        --- Professional ---                           Z86.010, Personal history of colonic polyps CPT copyright 2019 [employer]. All rights reserved. The codes documented in this report are preliminary and upon coder review may  be revised to meet current compliance requirements. Cristopher Estimable. Treylin Burtch, MD Norvel Richards, MD 01/22/2020 2:35:08 PM This report has been signed electronically. Number of Addenda: 0

## 2020-01-24 ENCOUNTER — Encounter (HOSPITAL_COMMUNITY): Payer: Self-pay | Admitting: Internal Medicine

## 2020-04-26 ENCOUNTER — Other Ambulatory Visit: Payer: Self-pay | Admitting: Nurse Practitioner

## 2020-04-26 DIAGNOSIS — K625 Hemorrhage of anus and rectum: Secondary | ICD-10-CM

## 2020-04-26 DIAGNOSIS — K5900 Constipation, unspecified: Secondary | ICD-10-CM

## 2020-04-26 DIAGNOSIS — R1030 Lower abdominal pain, unspecified: Secondary | ICD-10-CM

## 2020-04-30 ENCOUNTER — Encounter: Payer: Self-pay | Admitting: Nurse Practitioner

## 2020-04-30 ENCOUNTER — Other Ambulatory Visit: Payer: Self-pay

## 2020-04-30 ENCOUNTER — Ambulatory Visit (INDEPENDENT_AMBULATORY_CARE_PROVIDER_SITE_OTHER): Payer: Medicaid Other | Admitting: Nurse Practitioner

## 2020-04-30 VITALS — BP 138/80 | HR 87 | Temp 97.3°F | Ht 62.0 in | Wt 256.4 lb

## 2020-04-30 DIAGNOSIS — K219 Gastro-esophageal reflux disease without esophagitis: Secondary | ICD-10-CM

## 2020-04-30 DIAGNOSIS — R195 Other fecal abnormalities: Secondary | ICD-10-CM | POA: Diagnosis not present

## 2020-04-30 DIAGNOSIS — R1013 Epigastric pain: Secondary | ICD-10-CM | POA: Diagnosis not present

## 2020-04-30 DIAGNOSIS — K5900 Constipation, unspecified: Secondary | ICD-10-CM | POA: Diagnosis not present

## 2020-04-30 LAB — CBC WITH DIFFERENTIAL/PLATELET
Absolute Monocytes: 714 cells/uL (ref 200–950)
Basophils Absolute: 26 cells/uL (ref 0–200)
Basophils Relative: 0.3 %
Eosinophils Absolute: 232 cells/uL (ref 15–500)
Eosinophils Relative: 2.7 %
HCT: 42 % (ref 35.0–45.0)
Hemoglobin: 14.4 g/dL (ref 11.7–15.5)
Lymphs Abs: 2821 cells/uL (ref 850–3900)
MCH: 33.4 pg — ABNORMAL HIGH (ref 27.0–33.0)
MCHC: 34.3 g/dL (ref 32.0–36.0)
MCV: 97.4 fL (ref 80.0–100.0)
MPV: 9.3 fL (ref 7.5–12.5)
Monocytes Relative: 8.3 %
Neutro Abs: 4807 cells/uL (ref 1500–7800)
Neutrophils Relative %: 55.9 %
Platelets: 415 10*3/uL — ABNORMAL HIGH (ref 140–400)
RBC: 4.31 10*6/uL (ref 3.80–5.10)
RDW: 11.9 % (ref 11.0–15.0)
Total Lymphocyte: 32.8 %
WBC: 8.6 10*3/uL (ref 3.8–10.8)

## 2020-04-30 MED ORDER — LINACLOTIDE 72 MCG PO CAPS: 72.0000 ug | ORAL_CAPSULE | Freq: Every day | ORAL | 3 refills | Status: DC

## 2020-04-30 MED ORDER — OMEPRAZOLE 40 MG PO CPDR: 40.0000 mg | DELAYED_RELEASE_CAPSULE | Freq: Two times a day (BID) | ORAL | 3 refills | Status: DC

## 2020-04-30 NOTE — Progress Notes (Signed)
Referring Provider: Raiford Simmonds., PA-C Primary Care Physician:  Raiford Simmonds., PA-C Primary GI:  Dr. Gala Romney  Chief Complaint  Patient presents with  . upper abdominal pain  . Nausea    comes/goes  . Diarrhea    twice in the past week    HPI:   Sabrina Perez is a 29 y.o. female who presents for follow-up on GERD, constipation, history of tubulovillous adenoma of the colon.  The patient was last seen in our office 10/25/2019 for the same.  Due for repeat 3-year colonoscopy after colonoscopy completed in 2018 found a 13 mm polyp that was tubulovillous adenoma in the setting of scant toilet tissue hematochezia and straining.  Recommended 3-year repeat 2021.  History of chronic GERD and constipation.  At her last visit noted constipation doing well on Linzess, some morning time nausea and postprandial vomiting but admits she is missing her PPI occasionally.  GERD symptoms, when they occur, typically in the evening.  No other overt GI complaints.  Recommended increase omeprazole to 40 mg twice a day, try to remain compliant with medications, avoid eating 3 to 4 hours prior to bedtime, continue Linzess 72 mcg daily, schedule colonoscopy, follow-up in 6 months.  Colonoscopy was completed 01/22/2020 which found normal colon, no specimens collected.  Recommended 5-year repeat colonoscopy (2026).  The patient is on recall.  Today she states she doing okay overall. States she ran out of her PPI Friday (4 days ago) and states Omeprazole was previously working well for her. She got a migraine recently and took a bunch of medication including cold/flu, ASA, sinus medicine, etc and had some N/V, abdominal pain in the epigastric region. Typically food helps her symptoms, but then comes back about 30 minutes later. Also with diarrhea yesterday morning with her abdominal pain that lasted 15-20 minutes. States it was dark. Has been having more fatigue as well. Denies hematochezia. Denies fever,  chills, unintentional weight loss. Denies URI or flu-like symptoms. Denies loss of sense of taste or smell. The patient has received COVID-19 vaccination(s). Denies chest pain, dyspnea, dizziness, lightheadedness, syncope, near syncope. Denies any other upper or lower GI symptoms.  Past Medical History:  Diagnosis Date  . Asthma   . Environmental and seasonal allergies   . GERD (gastroesophageal reflux disease)     Past Surgical History:  Procedure Laterality Date  . COLONOSCOPY WITH PROPOFOL N/A 10/29/2016   Procedure: COLONOSCOPY WITH PROPOFOL;  Surgeon: Daneil Dolin, MD;  Location: [facility];  Service: Endoscopy;  Laterality: N/A;  1230   . COLONOSCOPY WITH PROPOFOL N/A 01/22/2020   Procedure: COLONOSCOPY WITH PROPOFOL;  Surgeon: Daneil Dolin, MD;  Location: [facility];  Service: Endoscopy;  Laterality: N/A;  2:15pm  . POLYPECTOMY  10/29/2016   Procedure: POLYPECTOMY;  Surgeon: Daneil Dolin, MD;  Location: [facility];  Service: Endoscopy;;  colon  . WISDOM TOOTH EXTRACTION      No current outpatient medications on file.   No current facility-administered medications for this visit.    Allergies as of 04/30/2020 - Review Complete 04/30/2020  Allergen Reaction Noted  . Motrin [ibuprofen] Palpitations 11/06/2012    Family History  Problem Relation Age of Onset  . Anxiety disorder Mother   . Drug abuse Father   . Alcohol abuse Maternal Uncle   . Alcohol abuse Paternal Uncle   . Drug abuse Paternal Uncle   . Anxiety disorder Paternal Aunt   . Depression Paternal Aunt   .  Colon polyps Paternal Aunt   . Colon cancer Neg Hx     Social History   Socioeconomic History  . Marital status: Single    Spouse name: Not on file  . Number of children: Not on file  . Years of education: Not on file  . Highest education level: Not on file  Occupational History  . Not on file  Tobacco Use  . Smoking status: Current Every Day Smoker    Packs/day: 1.00    Years:  4.00    Pack years: 4.00    Types: Cigarettes  . Smokeless tobacco: Never Used  Vaping Use  . Vaping Use: Every day  . Start date: 09/16/2014  Substance and Sexual Activity  . Alcohol use: Not Currently    Comment: 04/30/20: no ETOH in "months"; previously: wine once a week  . Drug use: Yes    Frequency: 7.0 times per week    Types: Marijuana  . Sexual activity: Never    Birth control/protection: Injection  Other Topics Concern  . Not on file  Social History Narrative  . Not on file   Social Determinants of Health   Financial Resource Strain:   . Difficulty of Paying Living Expenses: Not on file  Food Insecurity:   . Worried About Charity fundraiser in the Last Year: Not on file  . Ran Out of Food in the Last Year: Not on file  Transportation Needs:   . Lack of Transportation (Medical): Not on file  . Lack of Transportation (Non-Medical): Not on file  Physical Activity:   . Days of Exercise per Week: Not on file  . Minutes of Exercise per Session: Not on file  Stress:   . Feeling of Stress : Not on file  Social Connections:   . Frequency of Communication with Friends and Family: Not on file  . Frequency of Social Gatherings with Friends and Family: Not on file  . Attends Religious Services: Not on file  . Active Member of Clubs or Organizations: Not on file  . Attends Archivist Meetings: Not on file  . Marital Status: Not on file    Subjective: Review of Systems  Constitutional: Negative for chills, fever, malaise/fatigue and weight loss.  HENT: Negative for congestion and sore throat.   Respiratory: Negative for cough and shortness of breath.   Cardiovascular: Negative for chest pain and palpitations.  Gastrointestinal: Positive for abdominal pain, diarrhea (x 1 episode) and heartburn. Negative for blood in stool, constipation, melena, nausea and vomiting.  Musculoskeletal: Negative for joint pain and myalgias.  Skin: Negative for rash.  Neurological:  Negative for dizziness and weakness.  Endo/Heme/Allergies: Does not bruise/bleed easily.  Psychiatric/Behavioral: Negative for depression. The patient is not nervous/anxious.   All other systems reviewed and are negative.    Objective: BP 138/80   Pulse 87   Temp (!) 97.3 F (36.3 C)   Ht 5\' 2"  (1.575 m)   Wt 256 lb 6.4 oz (116.3 kg)   BMI 46.90 kg/m  Physical Exam Vitals and nursing note reviewed.  Constitutional:      General: She is not in acute distress.    Appearance: Normal appearance. She is well-developed. She is obese. She is not ill-appearing, toxic-appearing or diaphoretic.  HENT:     Head: Normocephalic and atraumatic.     Nose: No congestion or rhinorrhea.  Eyes:     General: No scleral icterus. Cardiovascular:     Rate and Rhythm: Normal rate and  regular rhythm.     Heart sounds: Normal heart sounds.  Pulmonary:     Effort: Pulmonary effort is normal. No respiratory distress.     Breath sounds: Normal breath sounds.  Abdominal:     General: Bowel sounds are normal.     Palpations: Abdomen is soft. There is no hepatomegaly, splenomegaly or mass.     Tenderness: There is abdominal tenderness in the epigastric area and left upper quadrant. There is no guarding or rebound.     Hernia: No hernia is present.  Skin:    General: Skin is warm and dry.     Coloration: Skin is not jaundiced.     Findings: No rash.  Neurological:     General: No focal deficit present.     Mental Status: She is alert and oriented to person, place, and time.  Psychiatric:        Attention and Perception: Attention normal.        Mood and Affect: Mood normal.        Speech: Speech normal.        Behavior: Behavior normal.        Thought Content: Thought content normal.        Cognition and Memory: Cognition and memory normal.      Assessment:  Very pleasant 28 year old female presents for follow-up on GERD.  States she ran out of her PPI about 4 to 5 days ago.  She is also  having epigastric pain and 1-2 episodes of brief, self-limiting diarrhea.  GERD with abdominal pain: Upon further discussion the patient states that she had a migraine a couple weeks ago and took multiple over-the-counter medications including a couple different types of NSAID, sinus concoction, flu and cold concoction, etc.  Likely multiple NSAIDs and high-dose.  This could have caused acute on chronic gastritis/esophagitis or possibly even erosion with ulcer.  She notes her stools have been intermittently black as well.  At this point I recommended she abstain strictly from all NSAIDs.  I will check a CBC and iFOBT for presence of blood.  May need an upper endoscopy depending on her results.  I will refill her omeprazole and recommend she increase it to 40 mg twice a day for the time being.  Diarrhea: 1-2 episodes, generally self-limiting.  Recently started 1 to 2 days ago.  Differentials are actually quite broad and I doubt occult colonic infection/colitis given the self-limiting nature and the infrequent stools.  At this point she can use Imodium as needed.  Monitor for any worsening or recurrent loose stools and call us if they occur.  She is asking for refill of her Linzess for her chronic constipation and I will send this to her pharmacy.   Plan: 1. CBC,iFOBT 2. Omeprazole 40 mg twice daily 3. Strict NSAID avoidance 4. Imodium as needed 5. Possible EGD pending lab results 6. Refill Linzess 7. Follow-up in 6 to 8 weeks    Thank you for allowing [place] to participate in the care of [place], DNP, AGNP-C Adult & Gerontological Nurse Practitioner [facility]   04/30/2020 1:50 PM   Disclaimer: This note was dictated with voice recognition software. Similar sounding words can inadvertently be transcribed and may not be corrected upon review.

## 2020-04-30 NOTE — Patient Instructions (Signed)
Your health issues we discussed today were:   GERD (reflux/heartburn) with upper abdominal pain: 1. Avoid all NSAIDs (ibuprofen, Motrin, Advil, Aleve, naproxen, Naprosyn, Goody powders, BC powders, aspirin).  This includes medications that may be added into things like flu, cold, sinus "mixes" (such as Advil cold and flu) 2. I am refilling your omeprazole 40 mg.  Take this twice a day, pursing in the morning and 30 minutes for your last meal the day 3. Have your blood test done as soon as you can 4. Complete your stool test and bring it back to our office 5. We may need to plan for an upper endoscopy depending on your results 6. Call us for worsening or severe symptoms  Brief, 1-2 episodes of diarrhea: 1. Given how infrequent your loose stools are and how recently they started I am not overly concerned about a an infection in your colon 2. You can use Imodium as needed if you have any more diarrhea 3. Call us if your diarrhea becomes worse  History of constipation: 1. As requested, I have refilled your prescription for Linzess 2. Do not take Linzess if you are having diarrhea  Overall I recommend:  1. Continue your other current medications 2. Return for follow-up in 6 to 8 weeks 3. Call us for any questions or concerns   ---------------------------------------------------------------  I am glad you have gotten your COVID-19 vaccination!  Even though you are fully vaccinated you should continue to follow CDC and state/local guidelines.  ---------------------------------------------------------------   At [facility] we value your feedback. You may receive a survey about your visit today. Please share your experience as we strive to create trusting relationships with our patients to provide genuine, compassionate, quality care.  We appreciate your understanding and patience as we review any laboratory studies, imaging, and other diagnostic tests that are ordered as we  care for you. Our office policy is 5 business days for review of these results, and any emergent or urgent results are addressed in a timely manner for your best interest. If you do not hear from our office in 1 week, please contact us.   We also encourage the use of MyChart, which contains your medical information for your review as well. If you are not enrolled in this feature, an access code is on this after visit summary for your convenience. Thank you for allowing [place] to be involved in your care.  It was great to see you today!  I hope you have a Merry Christmas and Happy Holidays!!

## 2020-05-01 NOTE — Progress Notes (Signed)
Cc'ed to pcp °

## 2020-05-02 ENCOUNTER — Ambulatory Visit (INDEPENDENT_AMBULATORY_CARE_PROVIDER_SITE_OTHER): Payer: Medicaid Other | Admitting: Nurse Practitioner

## 2020-05-02 DIAGNOSIS — R195 Other fecal abnormalities: Secondary | ICD-10-CM

## 2020-05-02 LAB — IFOBT (OCCULT BLOOD): IFOBT: NEGATIVE

## 2020-07-03 ENCOUNTER — Ambulatory Visit: Payer: Medicaid Other | Admitting: Nurse Practitioner

## 2020-07-03 NOTE — Progress Notes (Deleted)
Referring Provider: Raiford Simmonds., PA-C Primary Care Physician:  Raiford Simmonds., PA-C Primary GI:  Dr. Gala Romney  No chief complaint on file.   HPI:   Sabrina Perez is a 30 y.o. female who presents for follow-up on GERD and abdominal pain.  The patient was last seen in our office 04/30/2020 for epigastric pain, dark stools, GERD, constipation.  History of chronic GERD and constipation.  Colonoscopy up-to-date 2021 with a recommended 5-year repeat (previous tubulovillous adenoma) due in 2026.  At her last visit omeprazole noted effective but ran out.  Had a flare of symptoms with multiple over-the-counter medications (including aspirin products) for cold.  Had an episode of dark stools a day prior and increasing fatigue.  No other overt GI complaints.  Recommended CBC, iFOBT, refill omeprazole 40 mg twice daily, strict NSAID avoidance, Imodium as needed, possible EGD pending results, refill Linzess, follow-up in 6 to 8 weeks.  FOBT was negative, CBC with no anemia.  Follow-up precautions given.  Today she states she doing okay overall.  Past Medical History:  Diagnosis Date  . Asthma   . Environmental and seasonal allergies   . GERD (gastroesophageal reflux disease)     Past Surgical History:  Procedure Laterality Date  . COLONOSCOPY WITH PROPOFOL N/A 10/29/2016   Procedure: COLONOSCOPY WITH PROPOFOL;  Surgeon: Daneil Dolin, MD;  [facility];  Service: Endoscopy;  Laterality: N/A;  1230   . COLONOSCOPY WITH PROPOFOL N/A 01/22/2020   Procedure: COLONOSCOPY WITH PROPOFOL;  Surgeon: Daneil Dolin, MD;  [facility];  Service: Endoscopy;  Laterality: N/A;  2:15pm  . POLYPECTOMY  10/29/2016   Procedure: POLYPECTOMY;  Surgeon: Daneil Dolin, MD;  [facility];  Service: Endoscopy;;  colon  . WISDOM TOOTH EXTRACTION      Current Outpatient Medications  Medication Sig Dispense Refill  . linaclotide (LINZESS) 72 MCG capsule Take 1 capsule (72  mcg total) by mouth daily before breakfast. 90 capsule 3  . omeprazole (PRILOSEC) 40 MG capsule Take 1 capsule (40 mg total) by mouth in the morning and at bedtime. 60 capsule 3   No current facility-administered medications for this visit.    Allergies as of 07/03/2020 - Review Complete 04/30/2020  Allergen Reaction Noted  . Motrin [ibuprofen] Palpitations 11/06/2012    Family History  Problem Relation Age of Onset  . Anxiety disorder Mother   . Drug abuse Father   . Alcohol abuse Maternal Uncle   . Alcohol abuse Paternal Uncle   . Drug abuse Paternal Uncle   . Anxiety disorder Paternal Aunt   . Depression Paternal Aunt   . Colon polyps Paternal Aunt   . Colon cancer Neg Hx     Social History   Socioeconomic History  . Marital status: Single    Spouse name: Not on file  . Number of children: Not on file  . Years of education: Not on file  . Highest education level: Not on file  Occupational History  . Not on file  Tobacco Use  . Smoking status: Current Every Day Smoker    Packs/day: 1.00    Years: 4.00    Pack years: 4.00    Types: Cigarettes  . Smokeless tobacco: Never Used  Vaping Use  . Vaping Use: Every day  . Start date: 09/16/2014  Substance and Sexual Activity  . Alcohol use: Not Currently    Comment: 04/30/20: no ETOH in "months"; previously: wine once  a week  . Drug use: Yes    Frequency: 7.0 times per week    Types: Marijuana  . Sexual activity: Never    Birth control/protection: Injection  Other Topics Concern  . Not on file  Social History Narrative  . Not on file   Social Determinants of Health   Financial Resource Strain: Not on file  Food Insecurity: Not on file  Transportation Needs: Not on file  Physical Activity: Not on file  Stress: Not on file  Social Connections: Not on file    Subjective:*** Review of Systems  Constitutional: Negative for chills, fever, malaise/fatigue and weight loss.  HENT: Negative for congestion and sore  throat.   Respiratory: Negative for cough and shortness of breath.   Cardiovascular: Negative for chest pain and palpitations.  Gastrointestinal: Negative for abdominal pain, blood in stool, diarrhea, melena, nausea and vomiting.  Musculoskeletal: Negative for joint pain and myalgias.  Skin: Negative for rash.  Neurological: Negative for dizziness and weakness.  Endo/Heme/Allergies: Does not bruise/bleed easily.  Psychiatric/Behavioral: Negative for depression. The patient is not nervous/anxious.   All other systems reviewed and are negative.    Objective: There were no vitals taken for this visit. Physical Exam Vitals and nursing note reviewed.  Constitutional:      General: She is not in acute distress.    Appearance: Normal appearance. She is well-developed. She is not ill-appearing, toxic-appearing or diaphoretic.  HENT:     Head: Normocephalic and atraumatic.     Nose: No congestion or rhinorrhea.  Eyes:     General: No scleral icterus. Cardiovascular:     Rate and Rhythm: Normal rate and regular rhythm.     Heart sounds: Normal heart sounds.  Pulmonary:     Effort: Pulmonary effort is normal. No respiratory distress.     Breath sounds: Normal breath sounds.  Abdominal:     General: Bowel sounds are normal.     Palpations: Abdomen is soft. There is no hepatomegaly, splenomegaly or mass.     Tenderness: There is no abdominal tenderness. There is no guarding or rebound.     Hernia: No hernia is present.  Skin:    General: Skin is warm and dry.     Coloration: Skin is not jaundiced.     Findings: No rash.  Neurological:     General: No focal deficit present.     Mental Status: She is alert and oriented to person, place, and time.  Psychiatric:        Attention and Perception: Attention normal.        Mood and Affect: Mood normal.        Speech: Speech normal.        Behavior: Behavior normal.        Thought Content: Thought content normal.        Cognition and  Memory: Cognition and memory normal.      Assessment:  ***   Plan: ***    Thank you for allowing [place] to participate in the care of [place], DNP, AGNP-C Adult & Gerontological Nurse Practitioner [facility]   07/03/2020 7:56 AM   Disclaimer: This note was dictated with voice recognition software. Similar sounding words can inadvertently be transcribed and may not be corrected upon review.

## 2020-08-15 ENCOUNTER — Ambulatory Visit: Payer: Medicaid Other | Admitting: Nurse Practitioner

## 2020-08-15 NOTE — Progress Notes (Deleted)
Referring Provider: Raiford Simmonds., PA-C Primary Care Physician:  Raiford Simmonds., PA-C Primary GI:  Dr. Gala Romney  No chief complaint on file.   HPI:   Sabrina Perez is a 30 y.o. female who presents for follow-up on GERD and abdominal pain.  Patient was last seen in our office 04/30/2020 for epigastric pain, dark stools, GERD, constipation.  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  Updated colonoscopy in 2021 with normal colon and recommended 5-year repeat in 2026.  GERD previously well managed on Linzess.  At her last visit noted omeprazole works well for her but ran out 4 days prior.  Also had a migraine and took multiple medications some of which contained aspirin and had some breakthrough nausea/vomiting and epigastric pain.  Had some dark stools/diarrhea the day prior and lasted 15 to 20 minutes.  Some fatigue.  No other overt GI complaints.  Recommended CBC, iFOBT, strict NSAID avoidance, Imodium as needed, possible EGD pending lab results, refill Linzess, follow-up in 6 to 8 weeks.  Fecal occult blood testing was completed the next day and found to be negative.  CBC found normal hemoglobin at 14.4.  Today she states she doing okay overall.   Past Medical History:  Diagnosis Date  . Asthma   . Environmental and seasonal allergies   . GERD (gastroesophageal reflux disease)     Past Surgical History:  Procedure Laterality Date  . COLONOSCOPY WITH PROPOFOL N/A 10/29/2016   Procedure: COLONOSCOPY WITH PROPOFOL;  Surgeon: Daneil Dolin, MD;  Location: [facility];  Service: Endoscopy;  Laterality: N/A;  1230   . COLONOSCOPY WITH PROPOFOL N/A 01/22/2020   Procedure: COLONOSCOPY WITH PROPOFOL;  Surgeon: Daneil Dolin, MD;  Location: [facility];  Service: Endoscopy;  Laterality: N/A;  2:15pm  . POLYPECTOMY  10/29/2016   Procedure: POLYPECTOMY;  Surgeon: Daneil Dolin, MD;  Location: [facility];  Service: Endoscopy;;  colon  . WISDOM TOOTH EXTRACTION       Current Outpatient Medications  Medication Sig Dispense Refill  . linaclotide (LINZESS) 72 MCG capsule Take 1 capsule (72 mcg total) by mouth daily before breakfast. 90 capsule 3  . omeprazole (PRILOSEC) 40 MG capsule Take 1 capsule (40 mg total) by mouth in the morning and at bedtime. 60 capsule 3   No current facility-administered medications for this visit.    Allergies as of 08/15/2020 - Review Complete 04/30/2020  Allergen Reaction Noted  . Motrin [ibuprofen] Palpitations 11/06/2012    Family History  Problem Relation Age of Onset  . Anxiety disorder Mother   . Drug abuse Father   . Alcohol abuse Maternal Uncle   . Alcohol abuse Paternal Uncle   . Drug abuse Paternal Uncle   . Anxiety disorder Paternal Aunt   . Depression Paternal Aunt   . Colon polyps Paternal Aunt   . Colon cancer Neg Hx     Social History   Socioeconomic History  . Marital status: Single    Spouse name: Not on file  . Number of children: Not on file  . Years of education: Not on file  . Highest education level: Not on file  Occupational History  . Not on file  Tobacco Use  . Smoking status: Current Every Day Smoker    Packs/day: 1.00    Years: 4.00    Pack years: 4.00    Types: Cigarettes  . Smokeless tobacco: Never Used  Vaping Use  . Vaping Use:  Every day  . Start date: 09/16/2014  Substance and Sexual Activity  . Alcohol use: Not Currently    Comment: 04/30/20: no ETOH in "months"; previously: wine once a week  . Drug use: Yes    Frequency: 7.0 times per week    Types: Marijuana  . Sexual activity: Never    Birth control/protection: Injection  Other Topics Concern  . Not on file  Social History Narrative  . Not on file   Social Determinants of Health   Financial Resource Strain: Not on file  Food Insecurity: Not on file  Transportation Needs: Not on file  Physical Activity: Not on file  Stress: Not on file  Social Connections: Not on file    Subjective:*** Review of  Systems  Constitutional: Negative for chills, fever, malaise/fatigue and weight loss.  HENT: Negative for congestion and sore throat.   Respiratory: Negative for cough and shortness of breath.   Cardiovascular: Negative for chest pain and palpitations.  Gastrointestinal: Negative for abdominal pain, blood in stool, diarrhea, melena, nausea and vomiting.  Musculoskeletal: Negative for joint pain and myalgias.  Skin: Negative for rash.  Neurological: Negative for dizziness and weakness.  Endo/Heme/Allergies: Does not bruise/bleed easily.  Psychiatric/Behavioral: Negative for depression. The patient is not nervous/anxious.   All other systems reviewed and are negative.    Objective: There were no vitals taken for this visit. Physical Exam Vitals and nursing note reviewed.  Constitutional:      General: She is not in acute distress.    Appearance: Normal appearance. She is well-developed. She is not ill-appearing, toxic-appearing or diaphoretic.  HENT:     Head: Normocephalic and atraumatic.     Nose: No congestion or rhinorrhea.  Eyes:     General: No scleral icterus. Cardiovascular:     Rate and Rhythm: Normal rate and regular rhythm.     Heart sounds: Normal heart sounds.  Pulmonary:     Effort: Pulmonary effort is normal. No respiratory distress.     Breath sounds: Normal breath sounds.  Abdominal:     General: Bowel sounds are normal.     Palpations: Abdomen is soft. There is no hepatomegaly, splenomegaly or mass.     Tenderness: There is no abdominal tenderness. There is no guarding or rebound.     Hernia: No hernia is present.  Skin:    General: Skin is warm and dry.     Coloration: Skin is not jaundiced.     Findings: No rash.  Neurological:     General: No focal deficit present.     Mental Status: She is alert and oriented to person, place, and time.  Psychiatric:        Attention and Perception: Attention normal.        Mood and Affect: Mood normal.         Speech: Speech normal.        Behavior: Behavior normal.        Thought Content: Thought content normal.        Cognition and Memory: Cognition and memory normal.      Assessment:  ***   Plan: ***    Thank you for allowing [place] to participate in the care of [place], DNP, AGNP-C Adult & Gerontological Nurse Practitioner [facility]   08/15/2020 12:46 PM   Disclaimer: This note was dictated with voice recognition software. Similar sounding words can inadvertently be transcribed and may not be corrected upon review.

## 2021-04-20 ENCOUNTER — Other Ambulatory Visit: Payer: Self-pay

## 2021-04-20 ENCOUNTER — Emergency Department (HOSPITAL_COMMUNITY)
Admission: EM | Admit: 2021-04-20 | Discharge: 2021-04-20 | Disposition: A | Discharge status: Home / Self Care | Payer: Medicaid Other | Attending: Emergency Medicine | Admitting: Emergency Medicine

## 2021-04-20 DIAGNOSIS — F17210 Nicotine dependence, cigarettes, uncomplicated: Secondary | ICD-10-CM | POA: Insufficient documentation

## 2021-04-20 DIAGNOSIS — Z20822 Contact with and (suspected) exposure to covid-19: Secondary | ICD-10-CM | POA: Diagnosis not present

## 2021-04-20 DIAGNOSIS — J45909 Unspecified asthma, uncomplicated: Secondary | ICD-10-CM | POA: Diagnosis not present

## 2021-04-20 DIAGNOSIS — J101 Influenza due to other identified influenza virus with other respiratory manifestations: Secondary | ICD-10-CM | POA: Insufficient documentation

## 2021-04-20 DIAGNOSIS — R509 Fever, unspecified: Secondary | ICD-10-CM | POA: Diagnosis present

## 2021-04-20 DIAGNOSIS — J111 Influenza due to unidentified influenza virus with other respiratory manifestations: Secondary | ICD-10-CM

## 2021-04-20 LAB — RESP PANEL BY RT-PCR (FLU A&B, COVID) ARPGX2
Influenza A by PCR: POSITIVE — AB
Influenza B by PCR: NEGATIVE
SARS Coronavirus 2 by RT PCR: NEGATIVE

## 2021-04-20 LAB — GROUP A STREP BY PCR: Group A Strep by PCR: NOT DETECTED

## 2021-04-20 MED ORDER — ONDANSETRON 8 MG PO TBDP
8.0000 mg | ORAL_TABLET | Freq: Once | ORAL | Status: AC
  Administered 2021-04-20: 21:00:00 8 mg via ORAL
  Filled 2021-04-20: qty 1

## 2021-04-20 MED ORDER — BENZONATATE 200 MG PO CAPS: 200.0000 mg | ORAL_CAPSULE | Freq: Three times a day (TID) | ORAL | 0 refills | Status: DC | PRN

## 2021-04-20 NOTE — ED Provider Notes (Signed)
[facility] EMERGENCY DEPARTMENT Provider Note   CSN: [id number] Arrival date & time: 04/20/21  1819     History Chief Complaint  Patient presents with   Cough   Sore Throat    Sabrina Perez is a 30 y.o. female.   Cough Associated symptoms: fever, headaches and myalgias   Associated symptoms: no rash and no sore throat   Sore Throat Associated symptoms include headaches. Pertinent negatives include no abdominal pain.       Sabrina Perez is a 30 y.o. female who presents to the Emergency Department complaining of fever, chills, cough, headache, and generalized body aches.  Symptoms have been present for 2 days.  She is here with her son who is also here for evaluation of similar symptoms.  States cough has been mostly nonproductive.  Some reported loose stools and intermittent vomiting as well.  No abdominal pain.  States her symptoms began after her son became ill.  She reports decreased appetite, but continuing to drink fluids.  Max fever at home unknown.  Denies chest pain, shortness of breath, dysuria   Past Medical History:  Diagnosis Date   Asthma    Environmental and seasonal allergies    GERD (gastroesophageal reflux disease)     Patient Active Problem List   Diagnosis Date Noted   Dark stools 04/30/2020   Tubulovillous adenoma of colon 10/25/2019   Anemia 04/26/2019   GERD (gastroesophageal reflux disease) 02/22/2019   Nausea with vomiting 02/22/2019   Abdominal pain 09/15/2017   PTSD (post-traumatic stress disorder) 02/24/2017   Rectal bleeding 09/15/2016   Heme + stool 09/15/2016   Constipation 09/15/2016   [redacted] weeks gestation of pregnancy    Intrauterine growth restriction affecting antepartum care of mother    Placental abruption, antepartum 10/11/2014   IUGR (intrauterine growth retardation) affecting mother     Past Surgical History:  Procedure Laterality Date   COLONOSCOPY WITH PROPOFOL N/A 10/29/2016   Procedure: COLONOSCOPY WITH  PROPOFOL;  Surgeon: Daneil Dolin, MD;  [facility];  Service: Endoscopy;  Laterality: N/A;  1230    COLONOSCOPY WITH PROPOFOL N/A 01/22/2020   Procedure: COLONOSCOPY WITH PROPOFOL;  Surgeon: Daneil Dolin, MD;  [facility];  Service: Endoscopy;  Laterality: N/A;  2:15pm   POLYPECTOMY  10/29/2016   Procedure: POLYPECTOMY;  Surgeon: Daneil Dolin, MD;  [facility]: [facility];  Service: Endoscopy;;  colon   WISDOM TOOTH EXTRACTION       OB History     Gravida  1   Para      Term      Preterm      AB      Living         SAB      IAB      Ectopic      Multiple      Live Births              Family History  Problem Relation Age of Onset   Anxiety disorder Mother    Drug abuse Father    Alcohol abuse Maternal Uncle    Alcohol abuse Paternal Uncle    Drug abuse Paternal Uncle    Anxiety disorder Paternal Aunt    Depression Paternal Aunt    Colon polyps Paternal Aunt    Colon cancer Neg Hx     Social History   Tobacco Use   Smoking status: Every Day    Packs/day: 1.00  Years: 4.00    Pack years: 4.00    Types: Cigarettes   Smokeless tobacco: Never  Vaping Use   Vaping Use: Every day   Start date: 09/16/2014  Substance Use Topics   Alcohol use: Not Currently    Comment: 04/30/20: no ETOH in "months"; previously: wine once a week   Drug use: Yes    Frequency: 7.0 times per week    Types: Marijuana    Home Medications Prior to Admission medications   Medication Sig Start Date End Date Taking? Authorizing Provider  benzonatate (TESSALON) 200 MG capsule Take 1 capsule (200 mg total) by mouth 3 (three) times daily as needed for cough. Swallow whole, do not chew 04/20/21  Yes Markeise Mathews, PA-C  linaclotide (LINZESS) 72 MCG capsule Take 1 capsule (72 mcg total) by mouth daily before breakfast. 04/30/20   Carlis Stable, NP  omeprazole (PRILOSEC) 40 MG capsule Take 1 capsule (40 mg total) by mouth in the morning and at bedtime.  04/30/20   Carlis Stable, NP    Allergies    Motrin [ibuprofen]  Review of Systems   Review of Systems  Constitutional:  Positive for appetite change and fever.  HENT:  Positive for congestion. Negative for sore throat.   Respiratory:  Positive for cough.   Gastrointestinal:  Positive for diarrhea, nausea and vomiting. Negative for abdominal pain.  Genitourinary:  Negative for dysuria.  Musculoskeletal:  Positive for myalgias. Negative for neck pain and neck stiffness.  Skin:  Negative for rash.  Neurological:  Positive for headaches. Negative for weakness.  All other systems reviewed and are negative.  Physical Exam Updated Vital Signs BP (!) 141/94 (BP Location: Right Arm)   Pulse 80   Temp 97.7 F (36.5 C) (Oral)   Resp 16   Ht 5\' 2"  (1.575 m)   Wt 117.9 kg   SpO2 97%   BMI 47.55 kg/m   Physical Exam Vitals and nursing note reviewed.  Constitutional:      Appearance: Normal appearance. She is not ill-appearing.  HENT:     Nose: No rhinorrhea.     Mouth/Throat:     Mouth: Mucous membranes are moist.  Cardiovascular:     Rate and Rhythm: Normal rate and regular rhythm.  Pulmonary:     Effort: Pulmonary effort is normal. No respiratory distress.     Breath sounds: Normal breath sounds.  Abdominal:     Palpations: Abdomen is soft.     Tenderness: There is no abdominal tenderness.  Musculoskeletal:        General: Normal range of motion.     Cervical back: No rigidity.  Skin:    General: Skin is warm.     Findings: No rash.  Neurological:     General: No focal deficit present.     Mental Status: She is alert.     Motor: No weakness.    ED Results / Procedures / Treatments   Labs (all labs ordered are listed, but only abnormal results are displayed) Labs Reviewed  RESP PANEL BY RT-PCR (FLU A&B, COVID) ARPGX2 - Abnormal; Notable for the following components:      Result Value   Influenza A by PCR POSITIVE (*)    All other components within normal limits   GROUP A STREP BY PCR    EKG None  Radiology No results found.  Procedures Procedures   Medications Ordered in ED Medications  ondansetron (ZOFRAN-ODT) disintegrating tablet 8 mg (8 mg Oral Given  04/20/21 2050)    ED Course  I have reviewed the triage vital signs and the nursing notes.  Pertinent labs & imaging results that were available during my care of the patient were reviewed by me and considered in my medical decision making (see chart for details).    MDM Rules/Calculators/A&P                           Patient here with 2-day history of symptoms concerning for viral process.  She is here accompanied by her child who is also here for evaluation of similar symptoms.  On exam, patient well-appearing.  Nontoxic.  Mucous membranes are moist.  Doubt emergent process. vital signs reassuring.  Influenza A positive.  Discussed results with patient.  She appears appropriate for discharge home and agrees to symptomatic treatment with over-the-counter antipyretics and prescription written for Tessalon for cough.   Final Clinical Impression(s) / ED Diagnoses Final diagnoses:  Influenza    Rx / DC Orders ED Discharge Orders          Ordered    benzonatate (TESSALON) 200 MG capsule  3 times daily PRN        04/20/21 2038             Kem Parkinson, PA-C 04/20/21 2215    Isla Pence, MD 04/20/21 2225

## 2021-04-20 NOTE — ED Triage Notes (Signed)
Cough, fever, headache, sore throat, vomiting and diarrhea x 2 days.

## 2021-04-20 NOTE — Discharge Instructions (Signed)
Your flu test today is positive.  I recommend that you take extra strength Tylenol 1 every 4 hours for fever and/or body aches.  Drink plenty of fluids.  You may take the Tessalon as directed to help with cough.  Follow-up with your primary care provider for recheck, return emergency department for any new or worsening symptoms.

## 2021-09-12 ENCOUNTER — Ambulatory Visit: Payer: Medicaid Other | Admitting: Internal Medicine

## 2021-09-23 ENCOUNTER — Ambulatory Visit (INDEPENDENT_AMBULATORY_CARE_PROVIDER_SITE_OTHER): Payer: Medicaid Other | Admitting: Internal Medicine

## 2021-09-23 ENCOUNTER — Encounter: Payer: Self-pay | Admitting: Internal Medicine

## 2021-09-23 VITALS — BP 118/62 | HR 88 | Temp 97.5°F | Ht 62.0 in | Wt 254.8 lb

## 2021-09-23 DIAGNOSIS — R195 Other fecal abnormalities: Secondary | ICD-10-CM

## 2021-09-23 DIAGNOSIS — K5900 Constipation, unspecified: Secondary | ICD-10-CM

## 2021-09-23 DIAGNOSIS — R1013 Epigastric pain: Secondary | ICD-10-CM | POA: Diagnosis not present

## 2021-09-23 DIAGNOSIS — K219 Gastro-esophageal reflux disease without esophagitis: Secondary | ICD-10-CM

## 2021-09-23 DIAGNOSIS — D126 Benign neoplasm of colon, unspecified: Secondary | ICD-10-CM

## 2021-09-23 MED ORDER — LINACLOTIDE 72 MCG PO CAPS: 72.0000 ug | ORAL_CAPSULE | Freq: Every day | ORAL | 3 refills | Status: DC

## 2021-09-23 MED ORDER — OMEPRAZOLE 40 MG PO CPDR: 40.0000 mg | DELAYED_RELEASE_CAPSULE | Freq: Two times a day (BID) | ORAL | 3 refills | Status: DC

## 2021-09-23 NOTE — Progress Notes (Signed)
? ? ?Primary Care Physician:  Raiford Simmonds., PA-C ?Primary Gastroenterologist:  Dr. Gala Romney ? ?Pre-Procedure History & Physical: ?HPI:  Sabrina Perez is a 31 y.o. female here for follow-up GERD and constipation.  Historically, was doing well on Prilosec 40 mg in the morning.  She ran out several weeks ago.  Felt that once day therapy was not doing the job as well for this nice lady.  No dysphagia.  Significantly overweight at 254 pounds..  History of chronic constipation well managed on Linzess 72.  But, again, ran out of that medication and now going upwards of 2 weeks without a BM as she reports.  No rectal bleeding.  History of advanced adenoma removed from her  colon previously negative exam 2021; due for surveillance 2026. ? ?Past Medical History:  ?Diagnosis Date  ? Asthma   ? Environmental and seasonal allergies   ? GERD (gastroesophageal reflux disease)   ? ? ?Past Surgical History:  ?Procedure Laterality Date  ? COLONOSCOPY WITH PROPOFOL N/A 10/29/2016  ? Procedure: COLONOSCOPY WITH PROPOFOL;  Surgeon: Daneil Dolin, MD;  [facility]: [facility];  Service: Endoscopy;  Laterality: N/A;  1230 ?  ? COLONOSCOPY WITH PROPOFOL N/A 01/22/2020  ? Procedure: COLONOSCOPY WITH PROPOFOL;  Surgeon: Daneil Dolin, MD;  [facility];  Service: Endoscopy;  Laterality: N/A;  2:15pm  ? POLYPECTOMY  10/29/2016  ? Procedure: POLYPECTOMY;  Surgeon: Daneil Dolin, MD;  [facility]: [facility];  Service: Endoscopy;;  colon  ? WISDOM TOOTH EXTRACTION    ? ? ?Prior to Admission medications   ?Medication Sig Start Date End Date Taking? Authorizing Provider  ?fluticasone (FLONASE) 50 MCG/ACT nasal spray Place 1 spray into both nostrils daily. 09/11/21  Yes [provider]  ?montelukast (SINGULAIR) 10 MG tablet Take 10 mg by mouth at bedtime. 08/01/21  Yes [provider]  ?linaclotide (LINZESS) 72 MCG capsule Take 1 capsule (72 mcg total) by mouth daily before breakfast. ?Patient not taking:  Reported on 09/23/2021 04/30/20   Carlis Stable, NP  ?omeprazole (PRILOSEC) 40 MG capsule Take 1 capsule (40 mg total) by mouth in the morning and at bedtime. ?Patient not taking: Reported on 09/23/2021 04/30/20   Carlis Stable, NP  ? ? ?Allergies as of 09/23/2021 - Review Complete 09/23/2021  ?Allergen Reaction Noted  ? Motrin [ibuprofen] Palpitations 11/06/2012  ? ? ?Family History  ?Problem Relation Age of Onset  ? Anxiety disorder Mother   ? Drug abuse Father   ? Alcohol abuse Maternal Uncle   ? Alcohol abuse Paternal Uncle   ? Drug abuse Paternal Uncle   ? Anxiety disorder Paternal Aunt   ? Depression Paternal Aunt   ? Colon polyps Paternal Aunt   ? Colon cancer Neg Hx   ? ? ?Social History  ? ?Socioeconomic History  ? Marital status: Single  ?  Spouse name: Not on file  ? Number of children: Not on file  ? Years of education: Not on file  ? Highest education level: Not on file  ?Occupational History  ? Not on file  ?Tobacco Use  ? Smoking status: Every Day  ?  Packs/day: 1.00  ?  Years: 4.00  ?  Pack years: 4.00  ?  Types: Cigarettes  ? Smokeless tobacco: Never  ?Vaping Use  ? Vaping Use: Every day  ? Start date: 09/16/2014  ?Substance and Sexual Activity  ? Alcohol use: Not Currently  ?  Comment: 04/30/20: no ETOH  in "months"; previously: wine once a week  ? Drug use: Yes  ?  Frequency: 7.0 times per week  ?  Types: Marijuana  ? Sexual activity: Not Currently  ?  Birth control/protection: Injection  ?Other Topics Concern  ? Not on file  ?Social History Narrative  ? Not on file  ? ?Social Determinants of Health  ? ?Financial Resource Strain: Not on file  ?Food Insecurity: Not on file  ?Transportation Needs: Not on file  ?Physical Activity: Not on file  ?Stress: Not on file  ?Social Connections: Not on file  ?Intimate Partner Violence: Not on file  ? ? ?Review of Systems: ?See HPI, otherwise negative ROS ? ?Physical Exam: ?BP 118/62 (BP Location: Right Arm, Patient Position: Sitting, Cuff Size: Large)   Pulse 88   Temp  (!) 97.5 ?F (36.4 ?C) (Temporal)   Ht '5\' 2"'$  (1.575 m)   Wt 254 lb 12.8 oz (115.6 kg)   LMP 09/21/2021 (Exact Date)   SpO2 98%   Breastfeeding No   BMI 46.60 kg/m?  ?General:   Alert,  Well-developed, well-nourished, pleasant and cooperative in NAD ?Mouth:  No deformity or lesions. ?Neck:  Supple; no masses or thyromegaly. No significant cervical adenopathy. ?Lungs:  Clear throughout to auscultation.   No wheezes, crackles, or rhonchi. No acute distress. ?Heart:  Regular rate and rhythm; no murmurs, clicks, rubs,  or gallops. ?Abdomen: Non-distended, normal bowel sounds.  Soft and nontender without appreciable mass or hepatosplenomegaly.  ?Pulses:  Normal pulses noted. ?Extremities:  Without clubbing or edema. ? ?Impression/Plan: 31 year old morbidly obese lady with chronic GERD without any alarm symptoms.  Flare off medications.  I discussed the multipronged approach to management of gastroesophageal reflux.  We need to get her back on her meds and get her reflux under good control. ? ?Chronic constipation managed well with Linzess-again, came off her medication. ? ?Distant history of [place] [employer] colonic adenoma removed previously; due for surveillance colonoscopy 2026. ? ?Recommendations: ? ?GERD and constipation information provided ? ?We will increase  omeprazole to 40 mg twice daily (before supper and breakfast).  New prescription #180 with 3 refills ? ?Resume Linzess 72 daily (dispense 90 with 3 refills ? ?Office visit here in 6 months ? ?Colonoscopy 2026. ? ? ? ?Notice: This dictation was prepared with Dragon dictation along with smaller phrase technology. Any transcriptional errors that result from this process are unintentional and may not be corrected upon review.  ?

## 2021-09-23 NOTE — Patient Instructions (Signed)
It was good seeing you again today! ? ?GERD and constipation information provided ? ?We will increase your omeprazole to 40 mg twice daily (before supper and breakfast).  New prescription #180 with 3 refills ? ?Resume Linzess 72 daily (dispense 90 with 3 refills ? ?Office visit here in 6 months ? ?Colonoscopy 2026. ?

## 2022-03-31 ENCOUNTER — Ambulatory Visit: Payer: Medicaid Other | Admitting: Gastroenterology

## 2022-03-31 ENCOUNTER — Encounter: Payer: Self-pay | Admitting: Gastroenterology

## 2022-03-31 NOTE — H&P (View-Only) (Signed)
Primary Care Physician:  Raiford Simmonds., PA-C  Primary Gastroenterologist: Cristopher Estimable. Rourk, MD  Patient Location: Home Reason for Visit: follow up  Persons present on the virtual encounter, with roles: Patient - Sabrina Perez; Provider - Venetia Night, NP   Total time (minutes) spent on medical discussion: 17 minutes  Virtual Visit Encounter Note Visit is conducted virtually and was requested by patient.   I connected with Sabrina Perez on 04/01/22 at  8:00 AM EST by video and verified that I am speaking with the correct person using two identifiers.   I discussed the limitations, risks, security and privacy concerns of performing an evaluation and management service by video and the availability of in person appointments. I also discussed with the patient that there may be a patient responsible charge related to this service. The patient expressed understanding and agreed to proceed.  Chief Complaint  Patient presents with   Follow-up    History of Present Illness: Sabrina Perez is a 31 y.o. female with a history of asthma, chronic GERD, advanced colon polyps via colonoscopy in 2018, and chronic constipation  presenting for 6 month follow up.   Last colonoscopy in August 2021 normal without any polyps. Due for surveillance in 2026 given history of advanced polyp.   Last office visit 09/23/21 who was experiencing worsening reflux symptoms due to running out of omeprazole 40 mg daily. Denied dysphagia. Constipation previously well managed on Linzess 66mg daily but had also ran out and had gone 2 weeks without a BM. Advised PPI BID and linzess refilled.   Today: GERD: Good if she takes her medication. Taking it twice per day. Anything can give her symptoms even water (alkaline). Has typical triggers like tomato based products and caffeine. The later in the evenings she eats she may puke. She had awful reflux with vomiting after drinking fresh squeezed lemonade even  with her medicine. Has occasional nausea.  Denies overt dysphagia.  Constipation: She hasn't taken it for 2 weeks and for the last week she has been constipated but when she takes her medication eels like a razor blade coming out even when she takes the medication. Reports she tried the Linzess 145 mcg dose and had severe diarrhea. Not having any abdominal pain. Just some cramping when she needs to go.   Has had some black stools in the last month or so. Sometimes has some bright red blood around her stool with almost every bowel movement. Denies lightheadedness or dizziness.   Medications Current Meds  Medication Sig   linaclotide (LINZESS) 72 MCG capsule Take 1 capsule (72 mcg total) by mouth daily before breakfast.   omeprazole (PRILOSEC) 40 MG capsule Take 1 capsule (40 mg total) by mouth in the morning and at bedtime.     History Past Medical History:  Diagnosis Date   Asthma    Constipation    Environmental and seasonal allergies    GERD (gastroesophageal reflux disease)     Past Surgical History:  Procedure Laterality Date   COLONOSCOPY WITH PROPOFOL N/A 10/29/2016   Procedure: COLONOSCOPY WITH PROPOFOL;  Surgeon: RDaneil Dolin MD;  [facility];  Service: Endoscopy;  Laterality: N/A;  1230    COLONOSCOPY WITH PROPOFOL N/A 01/22/2020   Procedure: COLONOSCOPY WITH PROPOFOL;  Surgeon: RDaneil Dolin MD;  [facility];  Service: Endoscopy;  Laterality: N/A;  2:15pm   POLYPECTOMY  10/29/2016   Procedure: POLYPECTOMY;  Surgeon: RDaneil Dolin  MD;  [facility];  Service: Endoscopy;;  colon   WISDOM TOOTH EXTRACTION      Family History  Problem Relation Age of Onset   Anxiety disorder Mother    Drug abuse Father    Alcohol abuse Maternal Uncle    Alcohol abuse Paternal Uncle    Drug abuse Paternal Uncle    Anxiety disorder Paternal Aunt    Depression Paternal Aunt    Colon polyps Paternal Aunt    Colon cancer Neg Hx     Social History    Socioeconomic History   Marital status: Single    Spouse name: Not on file   Number of children: Not on file   Years of education: Not on file   Highest education level: Not on file  Occupational History   Not on file  Tobacco Use   Smoking status: Every Day    Packs/day: 1.00    Years: 4.00    Total pack years: 4.00    Types: Cigarettes   Smokeless tobacco: Never  Vaping Use   Vaping Use: Every day   Start date: 09/16/2014  Substance and Sexual Activity   Alcohol use: Not Currently    Comment: 04/30/20: no ETOH in "months"; previously: wine once a week   Drug use: Yes    Frequency: 7.0 times per week    Types: Marijuana   Sexual activity: Not Currently    Birth control/protection: Injection  Other Topics Concern   Not on file  Social History Narrative   Not on file   Social Determinants of Health   Financial Resource Strain: Not on file  Food Insecurity: Not on file  Transportation Needs: Not on file  Physical Activity: Not on file  Stress: Not on file  Social Connections: Not on file     Review of Systems: Gen: Denies fever, chills, anorexia. Denies fatigue, weakness, weight loss.  CV: Denies chest pain, palpitations, syncope, peripheral edema, and claudication. Resp: Denies dyspnea at rest, cough, wheezing, coughing up blood, and pleurisy. GI: see HPI Derm: Denies rash, itching, dry skin Psych: Denies depression, anxiety, memory loss, confusion. No homicidal or suicidal ideation.  Heme: Denies bruising, bleeding, and enlarged lymph nodes.  Observations/Objective: No distress. Alert and oriented. Pleasant. Well nourished. Normal mood and affect. Unable to perform complete physical exam due to video encounter.   Assessment:  GERD: Fairly well controlled on omeprazole 40 mg twice daily.  When she is out of medication or forgets to take it she will have frequent symptoms.  Has been out for couple weeks any need to refill.  Has typical reflux triggers but also  reports almost anything including alkaline water can trigger her symptoms.  Symptoms do seem to be worse to later she eats in the evenings.  While on her medication she did have recurrence of severe reflux after drinking freshly squeezed lemonade.  Also with some occasional nausea but denies any dysphagia or abdominal pain.  She has never had an EGD.  She has been experiencing some darker stools. Occasional NSAID use.  GERD diet/lifestyle modifications reinforced, omeprazole refill sent into pharmacy.  Chronic Constipation: Appears she is not adequately controlled on Linzess 72 mcg daily.  She has been out for 2 weeks and therefore for the last week she has been experiencing constipation with the need to strain and some lower abdominal cramping.  Previously did not tolerate Linzess 145 mcg due to severe diarrhea.  She has improvement in her ability to defecate  with Linzess 72 mcg daily but her stools remain firm/hard which is likely contributing to her rectal bleeding as stated below.  We discussed adding a stool softener to her regimen with taking Colace 100 mg daily, increasing to twice daily if needed.  If she does not notice relief with this regimen we may need to consider changing from Linzess to Amitiza or Trulance.  Rectal bleeding, dark stools: She admits noticing darker stools, sometimes black.  This is intermittent.  Denies any Pepto-Bismol or Maalox use.  History of occasional NSAID use, was frequent in 2021 when she was experiencing ongoing symptoms.  Suspect likely upper GI source and possible chronic gastritis, duodenitis, peptic ulcer disease, esophagitis given her chronic reflux.  Given she has never had an EGD, we will proceed with one now to further evaluate her symptoms.  She has also been experiencing some mild rectal bleeding with each bowel movement, sometimes with blood on the toilet tissue and sometimes in her stools.  Also with some associated knifelike rectal pain that occurs in the  setting of hard stools.  Her constipation is not adequately controlled therefore she may have an anal fissure versus hemorrhoids.  Advised she may use over-the-counter Preparation H for now and if she would like compounded hemorrhoid cream I am happy to call that in.  Suspect she will have improvement with rectal bleeding given his likely a benign anorectal source.  Last colonoscopy in 2021 without any abnormalities.  Plan:  Proceed with upper endoscopy with propofol by Dr. Gala Romney in near future: the risks, benefits, and alternatives have been discussed with the patient in detail. The patient states understanding and desires to proceed. ASA 3 (BMI) -Urine pregnancy preop CBC Continue omeprazole 40 mg twice daily, refilled today. Continue Linzess 72 mcg daily, refill today. Pepcid or tums as needed.  Colace 100 mg daily, increase to twice daily if no improvement. High-fiber diet, supplement with Benefiber 2-3 teaspoons daily. Avoid NSAIDs Follow-up 3 months post procedure    Follow Up Instructions:  I discussed the assessment and treatment plan with the patient. The patient was provided an opportunity to ask questions and all were answered. The patient agreed with the plan and demonstrated an understanding of the instructions.   The patient was advised to call back or seek an in-person evaluation if the symptoms worsen or if the condition fails to improve as anticipated.    Venetia Night, MSN, APRN, FNP-BC, AGACNP-BC [facility]

## 2022-03-31 NOTE — Progress Notes (Unsigned)
Primary Care Physician:  Raiford Simmonds., PA-C  Primary Gastroenterologist: Cristopher Estimable. Rourk, MD  Patient Location: Home Reason for Visit: follow up  Persons present on the virtual encounter, with roles: Patient - Sabrina Perez; Provider - Venetia Night, NP   Total time (minutes) spent on medical discussion: 17 minutes  Virtual Visit Encounter Note Visit is conducted virtually and was requested by patient.   I connected with Sabrina Perez on 04/01/22 at  8:00 AM EST by video and verified that I am speaking with the correct person using two identifiers.   I discussed the limitations, risks, security and privacy concerns of performing an evaluation and management service by video and the availability of in person appointments. I also discussed with the patient that there may be a patient responsible charge related to this service. The patient expressed understanding and agreed to proceed.  Chief Complaint  Patient presents with   Follow-up    History of Present Illness: Sabrina Perez is a 31 y.o. female with a history of asthma, chronic GERD, advanced colon polyps via colonoscopy in 2018, and chronic constipation  presenting for 6 month follow up.   Last colonoscopy in August 2021 normal without any polyps. Due for surveillance in 2026 given history of advanced polyp.   Last office visit 09/23/21 who was experiencing worsening reflux symptoms due to running out of omeprazole 40 mg daily. Denied dysphagia. Constipation previously well managed on Linzess 51mg daily but had also ran out and had gone 2 weeks without a BM. Advised PPI BID and linzess refilled.   Today: GERD: Good if she takes her medication. Taking it twice per day. Anything can give her symptoms even water (alkaline). Has typical triggers like tomato based products and caffeine. The later in the evenings she eats she may puke. She had awful reflux with vomiting after drinking fresh squeezed lemonade even  with her medicine. Has occasional nausea.  Denies overt dysphagia.  Constipation: She hasn't taken it for 2 weeks and for the last week she has been constipated but when she takes her medication eels like a razor blade coming out even when she takes the medication. Reports she tried the Linzess 145 mcg dose and had severe diarrhea. Not having any abdominal pain. Just some cramping when she needs to go.   Has had some black stools in the last month or so. Sometimes has some bright red blood around her stool with almost every bowel movement. Denies lightheadedness or dizziness.   Medications Current Meds  Medication Sig   linaclotide (LINZESS) 72 MCG capsule Take 1 capsule (72 mcg total) by mouth daily before breakfast.   omeprazole (PRILOSEC) 40 MG capsule Take 1 capsule (40 mg total) by mouth in the morning and at bedtime.     History Past Medical History:  Diagnosis Date   Asthma    Constipation    Environmental and seasonal allergies    GERD (gastroesophageal reflux disease)     Past Surgical History:  Procedure Laterality Date   COLONOSCOPY WITH PROPOFOL N/A 10/29/2016   Procedure: COLONOSCOPY WITH PROPOFOL;  Surgeon: RDaneil Dolin MD;  [facility];  Service: Endoscopy;  Laterality: N/A;  1230    COLONOSCOPY WITH PROPOFOL N/A 01/22/2020   Procedure: COLONOSCOPY WITH PROPOFOL;  Surgeon: RDaneil Dolin MD;  [facility];  Service: Endoscopy;  Laterality: N/A;  2:15pm   POLYPECTOMY  10/29/2016   Procedure: POLYPECTOMY;  Surgeon: RDaneil Dolin  MD;  [facility];  Service: Endoscopy;;  colon   WISDOM TOOTH EXTRACTION      Family History  Problem Relation Age of Onset   Anxiety disorder Mother    Drug abuse Father    Alcohol abuse Maternal Uncle    Alcohol abuse Paternal Uncle    Drug abuse Paternal Uncle    Anxiety disorder Paternal Aunt    Depression Paternal Aunt    Colon polyps Paternal Aunt    Colon cancer Neg Hx     Social History    Socioeconomic History   Marital status: Single    Spouse name: Not on file   Number of children: Not on file   Years of education: Not on file   Highest education level: Not on file  Occupational History   Not on file  Tobacco Use   Smoking status: Every Day    Packs/day: 1.00    Years: 4.00    Total pack years: 4.00    Types: Cigarettes   Smokeless tobacco: Never  Vaping Use   Vaping Use: Every day   Start date: 09/16/2014  Substance and Sexual Activity   Alcohol use: Not Currently    Comment: 04/30/20: no ETOH in "months"; previously: wine once a week   Drug use: Yes    Frequency: 7.0 times per week    Types: Marijuana   Sexual activity: Not Currently    Birth control/protection: Injection  Other Topics Concern   Not on file  Social History Narrative   Not on file   Social Determinants of Health   Financial Resource Strain: Not on file  Food Insecurity: Not on file  Transportation Needs: Not on file  Physical Activity: Not on file  Stress: Not on file  Social Connections: Not on file     Review of Systems: Gen: Denies fever, chills, anorexia. Denies fatigue, weakness, weight loss.  CV: Denies chest pain, palpitations, syncope, peripheral edema, and claudication. Resp: Denies dyspnea at rest, cough, wheezing, coughing up blood, and pleurisy. GI: see HPI Derm: Denies rash, itching, dry skin Psych: Denies depression, anxiety, memory loss, confusion. No homicidal or suicidal ideation.  Heme: Denies bruising, bleeding, and enlarged lymph nodes.  Observations/Objective: No distress. Alert and oriented. Pleasant. Well nourished. Normal mood and affect. Unable to perform complete physical exam due to video encounter.   Assessment:  GERD: Fairly well controlled on omeprazole 40 mg twice daily.  When she is out of medication or forgets to take it she will have frequent symptoms.  Has been out for couple weeks any need to refill.  Has typical reflux triggers but also  reports almost anything including alkaline water can trigger her symptoms.  Symptoms do seem to be worse to later she eats in the evenings.  While on her medication she did have recurrence of severe reflux after drinking freshly squeezed lemonade.  Also with some occasional nausea but denies any dysphagia or abdominal pain.  She has never had an EGD.  She has been experiencing some darker stools. Occasional NSAID use.  GERD diet/lifestyle modifications reinforced, omeprazole refill sent into pharmacy.  Chronic Constipation: Appears she is not adequately controlled on Linzess 72 mcg daily.  She has been out for 2 weeks and therefore for the last week she has been experiencing constipation with the need to strain and some lower abdominal cramping.  Previously did not tolerate Linzess 145 mcg due to severe diarrhea.  She has improvement in her ability to defecate  with Linzess 72 mcg daily but her stools remain firm/hard which is likely contributing to her rectal bleeding as stated below.  We discussed adding a stool softener to her regimen with taking Colace 100 mg daily, increasing to twice daily if needed.  If she does not notice relief with this regimen we may need to consider changing from Linzess to Amitiza or Trulance.  Rectal bleeding, dark stools: She admits noticing darker stools, sometimes black.  This is intermittent.  Denies any Pepto-Bismol or Maalox use.  History of occasional NSAID use, was frequent in 2021 when she was experiencing ongoing symptoms.  Suspect likely upper GI source and possible chronic gastritis, duodenitis, peptic ulcer disease, esophagitis given her chronic reflux.  Given she has never had an EGD, we will proceed with one now to further evaluate her symptoms.  She has also been experiencing some mild rectal bleeding with each bowel movement, sometimes with blood on the toilet tissue and sometimes in her stools.  Also with some associated knifelike rectal pain that occurs in the  setting of hard stools.  Her constipation is not adequately controlled therefore she may have an anal fissure versus hemorrhoids.  Advised she may use over-the-counter Preparation H for now and if she would like compounded hemorrhoid cream I am happy to call that in.  Suspect she will have improvement with rectal bleeding given his likely a benign anorectal source.  Last colonoscopy in 2021 without any abnormalities.  Plan:  Proceed with upper endoscopy with propofol by Dr. Gala Romney in near future: the risks, benefits, and alternatives have been discussed with the patient in detail. The patient states understanding and desires to proceed. ASA 3 (BMI) -Urine pregnancy preop CBC Continue omeprazole 40 mg twice daily, refilled today. Continue Linzess 72 mcg daily, refill today. Pepcid or tums as needed.  Colace 100 mg daily, increase to twice daily if no improvement. High-fiber diet, supplement with Benefiber 2-3 teaspoons daily. Avoid NSAIDs Follow-up 3 months post procedure    Follow Up Instructions:  I discussed the assessment and treatment plan with the patient. The patient was provided an opportunity to ask questions and all were answered. The patient agreed with the plan and demonstrated an understanding of the instructions.   The patient was advised to call back or seek an in-person evaluation if the symptoms worsen or if the condition fails to improve as anticipated.    Venetia Night, MSN, APRN, FNP-BC, AGACNP-BC [facility]

## 2022-04-01 ENCOUNTER — Telehealth (INDEPENDENT_AMBULATORY_CARE_PROVIDER_SITE_OTHER): Payer: Medicaid Other | Admitting: Gastroenterology

## 2022-04-01 ENCOUNTER — Telehealth: Payer: Self-pay | Admitting: *Deleted

## 2022-04-01 ENCOUNTER — Encounter: Payer: Self-pay | Admitting: Gastroenterology

## 2022-04-01 ENCOUNTER — Telehealth: Payer: Self-pay | Admitting: Gastroenterology

## 2022-04-01 VITALS — Ht 63.0 in | Wt 260.0 lb

## 2022-04-01 DIAGNOSIS — K219 Gastro-esophageal reflux disease without esophagitis: Secondary | ICD-10-CM | POA: Diagnosis not present

## 2022-04-01 DIAGNOSIS — R195 Other fecal abnormalities: Secondary | ICD-10-CM

## 2022-04-01 DIAGNOSIS — K602 Anal fissure, unspecified: Secondary | ICD-10-CM

## 2022-04-01 DIAGNOSIS — K5900 Constipation, unspecified: Secondary | ICD-10-CM

## 2022-04-01 DIAGNOSIS — K625 Hemorrhage of anus and rectum: Secondary | ICD-10-CM

## 2022-04-01 LAB — CBC
HCT: 41 % (ref 35.0–45.0)
Hemoglobin: 14.3 g/dL (ref 11.7–15.5)
MCH: 33.3 pg — ABNORMAL HIGH (ref 27.0–33.0)
MCHC: 34.9 g/dL (ref 32.0–36.0)
MCV: 95.6 fL (ref 80.0–100.0)
MPV: 8.7 fL (ref 7.5–12.5)
Platelets: 478 10*3/uL — ABNORMAL HIGH (ref 140–400)
RBC: 4.29 10*6/uL (ref 3.80–5.10)
RDW: 12.7 % (ref 11.0–15.0)
WBC: 10.6 10*3/uL (ref 3.8–10.8)

## 2022-04-01 MED ORDER — OMEPRAZOLE 40 MG PO CPDR: 40.0000 mg | DELAYED_RELEASE_CAPSULE | Freq: Two times a day (BID) | ORAL | 3 refills | Status: AC

## 2022-04-01 MED ORDER — LINACLOTIDE 72 MCG PO CAPS: 72.0000 ug | ORAL_CAPSULE | Freq: Every day | ORAL | 3 refills | Status: AC

## 2022-04-01 MED ORDER — DOCUSATE SODIUM 100 MG PO CAPS: 100.0000 mg | ORAL_CAPSULE | Freq: Two times a day (BID) | ORAL | 5 refills | Status: AC

## 2022-04-01 NOTE — Patient Instructions (Addendum)
I am ordering a CBC for you to have completed at [facility].  This is across the street from [facility] emergency room on second floor.  This is to help a check for anemia given your dark stools and some rectal bleeding.  For your constipation: -Continue Linzess 72 mcg daily, I have refilled this today. - Begin Colace 100 mg daily, after couple weeks if you do not have improvement in your hard stools, you may increase to 100 mg twice daily. -Ensure you are getting enough water intake daily with at least 4-6 glasses of water daily. -Increasing exercise with least 15-30 minutes daily can help improve constipation. -Follow high-fiber diet.  You may supplement your diet with Benefiber 2-3 teaspoons daily.  For your reflux: -Continue omeprazole 40 mg twice daily. -You may use Pepcid or Tums as needed for any breakthrough reflux symptoms.  If symptoms become frequent you may take Pepcid 20 mg daily at bedtime. -Follow a GERD diet:  Avoid fried, fatty, greasy, spicy, citrus foods. Avoid caffeine and carbonated beverages. Avoid chocolate. Try eating 4-6 small meals a day rather than 3 large meals. Do not eat within 3 hours of laying down. Prop head of bed up on wood or bricks to create a 6 inch incline.  Regarding your rectal bleeding and dark stools: -We will schedule you for an upper endoscopy to further evaluate for any source of your black/dark stools. - I suspect that if we control your constipation that you have improvement in your rectal bleeding as I believe it may be related to an anal fissure or hemorrhoids.  Begin your stool softener should help this and if it does not we may consider repeating colonoscopy given its been 2 years. -If you continue to have pain even after taking stool softeners please let me know, I can call in a compounded hemorrhoid cream to [facility] that will treat a possible anal fissure. (This is out-of-pocket as insurance does not cover it).  You may use  Preparation H ointment in the meantime.  It was a pleasure to see you today! I want to create trusting relationships with patients. If you receive a survey regarding your visit,  I greatly appreciate you taking time to fill this out on paper or through your MyChart. I value your feedback.  Venetia Night, MSN, FNP-BC, AGACNP-BC [facility]

## 2022-04-01 NOTE — Telephone Encounter (Signed)
Sabrina Perez, you are scheduled for a virtual visit with your provider today.  Just as we do with appointments in the office, we must obtain your consent to participate.  Your consent will be active for this visit and any virtual visit you may have with one of our providers in the next 365 days.  If you have a MyChart account, I can also send a copy of this consent to you electronically.  All virtual visits are billed to your insurance company just like a traditional visit in the office.  As this is a virtual visit, video technology does not allow for your provider to perform a traditional examination.  This may limit your provider's ability to fully assess your condition.  If your provider identifies any concerns that need to be evaluated in person or the need to arrange testing such as labs, EKG, etc, we will make arrangements to do so.  Although advances in technology are sophisticated, we cannot ensure that it will always work on either your end or our end.  If the connection with a video visit is poor, we may have to switch to a telephone visit.  With either a video or telephone visit, we are not always able to ensure that we have a secure connection.   I need to obtain your verbal consent now.   Are you willing to proceed with your visit today? Patient consent to virtual video visit.

## 2022-04-01 NOTE — Telephone Encounter (Signed)
Mindy/Tammy-please schedule patient for any upper endoscopy with Dr. Gala Romney.  ASA 2  - Urine pregnancy preop Dx: Dark stools, GERD   Jamita Mckelvin -I ordered CBC, please mail lab requisition to patient along with her AVS.   Leafy Ro - Patient needs follow-up visit 3 months post procedure (NIC)  Venetia Night, MSN, APRN, FNP-BC, AGACNP-BC [facility]

## 2022-04-01 NOTE — Telephone Encounter (Signed)
Spoke with pt. Scheduled for procedure 12/7 at 7:30am. Aware will send instructions via mychart. Also aware needs urine preg done prior.

## 2022-04-01 NOTE — Addendum Note (Signed)
Addended by: Cheron Every on: 04/01/2022 01:27 PM   Modules accepted: Orders

## 2022-04-01 NOTE — Telephone Encounter (Signed)
PA approved via [facility]. [id number], DOS: Apr 30, 2022 - Jul 29, 2022

## 2022-04-01 NOTE — Telephone Encounter (Signed)
Mailed lab requisitions and AVS.

## 2022-04-28 ENCOUNTER — Other Ambulatory Visit (HOSPITAL_COMMUNITY)
Admission: RE | Admit: 2022-04-28 | Discharge: 2022-04-28 | Disposition: A | Discharge status: Home / Self Care | Payer: Medicaid Other | Source: Ambulatory Visit | Attending: Internal Medicine | Admitting: Internal Medicine

## 2022-04-28 DIAGNOSIS — R195 Other fecal abnormalities: Secondary | ICD-10-CM

## 2022-04-28 DIAGNOSIS — K219 Gastro-esophageal reflux disease without esophagitis: Secondary | ICD-10-CM

## 2022-04-28 LAB — PREGNANCY, URINE: Preg Test, Ur: NEGATIVE

## 2022-04-30 ENCOUNTER — Ambulatory Visit (HOSPITAL_COMMUNITY)
Admission: RE | Admit: 2022-04-30 | Discharge: 2022-04-30 | Disposition: A | Discharge status: Home / Self Care | Payer: Medicaid Other | Attending: Internal Medicine | Admitting: Internal Medicine

## 2022-04-30 ENCOUNTER — Ambulatory Visit (HOSPITAL_BASED_OUTPATIENT_CLINIC_OR_DEPARTMENT_OTHER): Payer: Medicaid Other | Admitting: Anesthesiology

## 2022-04-30 ENCOUNTER — Other Ambulatory Visit: Payer: Self-pay

## 2022-04-30 ENCOUNTER — Encounter (HOSPITAL_COMMUNITY): Payer: Self-pay | Admitting: Internal Medicine

## 2022-04-30 ENCOUNTER — Ambulatory Visit (HOSPITAL_COMMUNITY): Payer: Medicaid Other | Admitting: Anesthesiology

## 2022-04-30 ENCOUNTER — Encounter (HOSPITAL_COMMUNITY)
Admission: RE | Disposition: A | Discharge status: Home / Self Care | Payer: Self-pay | Source: Home / Self Care | Attending: Internal Medicine

## 2022-04-30 DIAGNOSIS — K2950 Unspecified chronic gastritis without bleeding: Secondary | ICD-10-CM | POA: Diagnosis not present

## 2022-04-30 DIAGNOSIS — F419 Anxiety disorder, unspecified: Secondary | ICD-10-CM | POA: Insufficient documentation

## 2022-04-30 DIAGNOSIS — K219 Gastro-esophageal reflux disease without esophagitis: Secondary | ICD-10-CM | POA: Diagnosis not present

## 2022-04-30 DIAGNOSIS — E661 Drug-induced obesity: Secondary | ICD-10-CM | POA: Insufficient documentation

## 2022-04-30 DIAGNOSIS — R195 Other fecal abnormalities: Secondary | ICD-10-CM

## 2022-04-30 DIAGNOSIS — J45909 Unspecified asthma, uncomplicated: Secondary | ICD-10-CM

## 2022-04-30 DIAGNOSIS — F1290 Cannabis use, unspecified, uncomplicated: Secondary | ICD-10-CM | POA: Insufficient documentation

## 2022-04-30 DIAGNOSIS — F17200 Nicotine dependence, unspecified, uncomplicated: Secondary | ICD-10-CM | POA: Diagnosis not present

## 2022-04-30 DIAGNOSIS — I491 Atrial premature depolarization: Secondary | ICD-10-CM | POA: Diagnosis not present

## 2022-04-30 DIAGNOSIS — R1013 Epigastric pain: Secondary | ICD-10-CM

## 2022-04-30 DIAGNOSIS — B9681 Helicobacter pylori [H. pylori] as the cause of diseases classified elsewhere: Secondary | ICD-10-CM | POA: Diagnosis not present

## 2022-04-30 DIAGNOSIS — F17210 Nicotine dependence, cigarettes, uncomplicated: Secondary | ICD-10-CM | POA: Diagnosis not present

## 2022-04-30 DIAGNOSIS — Z79899 Other long term (current) drug therapy: Secondary | ICD-10-CM | POA: Insufficient documentation

## 2022-04-30 DIAGNOSIS — Z6841 Body Mass Index (BMI) 40.0 and over, adult: Secondary | ICD-10-CM | POA: Insufficient documentation

## 2022-04-30 HISTORY — PX: ESOPHAGOGASTRODUODENOSCOPY (EGD) WITH PROPOFOL: SHX5813

## 2022-04-30 HISTORY — PX: BIOPSY: SHX5522

## 2022-04-30 SURGERY — ESOPHAGOGASTRODUODENOSCOPY (EGD) WITH PROPOFOL
Anesthesia: General

## 2022-04-30 MED ORDER — PROPOFOL 500 MG/50ML IV EMUL
INTRAVENOUS | Status: AC
  Filled 2022-04-30: qty 50

## 2022-04-30 MED ORDER — PROPOFOL 500 MG/50ML IV EMUL
INTRAVENOUS | Status: DC | PRN
  Administered 2022-04-30: 180 ug/kg/min via INTRAVENOUS

## 2022-04-30 MED ORDER — OXYMETAZOLINE HCL 0.05 % NA SOLN
2.0000 | Freq: Once | NASAL | Status: AC
  Administered 2022-04-30: 2 via NASAL
  Filled 2022-04-30: qty 15

## 2022-04-30 MED ORDER — LACTATED RINGERS IV SOLN: INTRAVENOUS | Status: DC

## 2022-04-30 MED ORDER — STERILE WATER FOR IRRIGATION IR SOLN
Status: DC | PRN
  Administered 2022-04-30: 60 mL

## 2022-04-30 MED ORDER — PROPOFOL 10 MG/ML IV BOLUS
INTRAVENOUS | Status: DC | PRN
  Administered 2022-04-30: 80 mg via INTRAVENOUS
  Administered 2022-04-30: 50 mg via INTRAVENOUS

## 2022-04-30 NOTE — Interval H&P Note (Signed)
History and Physical Interval Note:  04/30/2022 8:57 AM  Sabrina Perez  has presented today for surgery, with the diagnosis of dark stools, gerd.  The various methods of treatment have been discussed with the patient and family. After consideration of risks, benefits and other options for treatment, the patient has consented to  Procedure(s) with comments: ESOPHAGOGASTRODUODENOSCOPY (EGD) WITH PROPOFOL (N/A) - 7:30am, asa 2 as a surgical intervention.  The patient's history has been reviewed, patient examined, no change in status, stable for surgery.  I have reviewed the patient's chart and labs.  Questions were answered to the patient's satisfaction.     Manus Rudd    Patient seen and examined.  No change.  No dysphagia.  Hemoglobin remains normal.   Reflux controlled on once to twice daily PPI.  I have offered the patient an EGD today.  The risks, benefits, limitations, alternatives and imponderables have been reviewed with the patient. Potential for esophageal dilation, biopsy, etc. have also been reviewed.  Questions have been answered. All parties agreeable.

## 2022-04-30 NOTE — Anesthesia Preprocedure Evaluation (Addendum)
Anesthesia Evaluation  Patient identified by MRN, date of birth, ID band Patient awake    Reviewed: Allergy & Precautions, H&P , NPO status , Patient's Chart, lab work & pertinent test results  Airway Mallampati: II  TM Distance: >3 FB Neck ROM: Full    Dental  (+) Dental Advisory Given, Missing   Pulmonary asthma , Current Smoker and Patient abstained from smoking.   Pulmonary exam normal breath sounds clear to auscultation       Cardiovascular negative cardio ROS Normal cardiovascular exam Rhythm:Regular Rate:Normal     Neuro/Psych  PSYCHIATRIC DISORDERS Anxiety     negative neurological ROS     GI/Hepatic ,GERD  Medicated,,(+)     substance abuse (last use marijuana this morning)  alcohol use and marijuana use  Endo/Other    Morbid obesity  Renal/GU negative Renal ROS  negative genitourinary   Musculoskeletal negative musculoskeletal ROS (+)    Abdominal   Peds negative pediatric ROS (+)  Hematology  (+) Blood dyscrasia, anemia   Anesthesia Other Findings Nasal stuffiness, almost completely blocked  Reproductive/Obstetrics negative OB ROS                             Anesthesia Physical Anesthesia Plan  ASA: 3  Anesthesia Plan: General   Post-op Pain Management: Minimal or no pain anticipated   Induction: Intravenous  PONV Risk Score and Plan: 1 and Propofol infusion  Airway Management Planned: Nasal Cannula, Natural Airway and Simple Face Mask  Additional Equipment:   Intra-op Plan:   Post-operative Plan:   Informed Consent: I have reviewed the patients History and Physical, chart, labs and discussed the procedure including the risks, benefits and alternatives for the proposed anesthesia with the patient or authorized representative who has indicated his/her understanding and acceptance.     Dental advisory given  Plan Discussed with: CRNA and Surgeon  Anesthesia  Plan Comments:        Anesthesia Quick Evaluation

## 2022-04-30 NOTE — Transfer of Care (Signed)
Immediate Anesthesia Transfer of Care Note  Patient: Sabrina Perez  Procedure(s) Performed: ESOPHAGOGASTRODUODENOSCOPY (EGD) WITH PROPOFOL BIOPSY  Patient Location: PACU  Anesthesia Type:General  Level of Consciousness: awake, alert , and oriented  Airway & Oxygen Therapy: Patient Spontanous Breathing  Post-op Assessment: Report given to RN, Post -op Vital signs reviewed and stable, Patient moving all extremities X 4, and Patient able to stick tongue midline  Post vital signs: Reviewed  Last Vitals:  Vitals Value Taken Time  BP 107/53 04/30/22 0924  Temp 36.4 C 04/30/22 0924  Pulse 78 04/30/22 0924  Resp 24 04/30/22 0924  SpO2 96 % 04/30/22 0924    Last Pain:  Vitals:   04/30/22 0924  TempSrc: Axillary  PainSc: 0-No pain      Patients Stated Pain Goal: 10 (79/03/83 [id number])  Complications: No notable events documented.

## 2022-04-30 NOTE — Discharge Instructions (Addendum)
EGD Discharge instructions Please read the instructions outlined below and refer to this sheet in the next few weeks. These discharge instructions provide you with general information on caring for yourself after you leave the hospital. Your doctor may also give you specific instructions. While your treatment has been planned according to the most current medical practices available, unavoidable complications occasionally occur. If you have any problems or questions after discharge, please call your doctor. ACTIVITY You may resume your regular activity but move at a slower pace for the next 24 hours.  Take frequent rest periods for the next 24 hours.  Walking will help expel (get rid of) the air and reduce the bloated feeling in your abdomen.  No driving for 24 hours (because of the anesthesia (medicine) used during the test).  You may shower.  Do not sign any important legal documents or operate any machinery for 24 hours (because of the anesthesia used during the test).  NUTRITION Drink plenty of fluids.  You may resume your normal diet.  Begin with a light meal and progress to your normal diet.  Avoid alcoholic beverages for 24 hours or as instructed by your caregiver.  MEDICATIONS You may resume your normal medications unless your caregiver tells you otherwise.  WHAT YOU CAN EXPECT TODAY You may experience abdominal discomfort such as a feeling of fullness or "gas" pains.  FOLLOW-UP Your doctor will discuss the results of your test with you.  SEEK IMMEDIATE MEDICAL ATTENTION IF ANY OF THE FOLLOWING OCCUR: Excessive nausea (feeling sick to your stomach) and/or vomiting.  Severe abdominal pain and distention (swelling).  Trouble swallowing.  Temperature over 101 F (37.8 C).  Rectal bleeding or vomiting of blood.       You had mild inflammation in your stomach.  Biopsies taken.  Continue your current medications.    Further recommendations to follow pending review of pathology  report  Office visit with Venetia Night in 3 months   at patient request, I called [phone] -  call rolled to voicemail.  Left a message with   DO NOT TAKE ANY ANTIHISTAMINES , SUDAFED, UNTIL AFTER 8 PM TODAY PER ANESTHESIA, ALSO, DO NOT USE MARIJUANA OR ANY OTHER ILLICIT DRUGS WITHIN 24 HOURS OF ANESTHESIA

## 2022-04-30 NOTE — Anesthesia Postprocedure Evaluation (Signed)
Anesthesia Post Note  Patient: Architect  Procedure(s) Performed: ESOPHAGOGASTRODUODENOSCOPY (EGD) WITH PROPOFOL BIOPSY  Patient location during evaluation: Phase II Anesthesia Type: General Level of consciousness: awake and alert and oriented Pain management: pain level controlled Vital Signs Assessment: post-procedure vital signs reviewed and stable Respiratory status: spontaneous breathing, nonlabored ventilation and respiratory function stable Cardiovascular status: blood pressure returned to baseline and stable Postop Assessment: no apparent nausea or vomiting Anesthetic complications: no Comments: Bigeminy during the procedure, 12 lead EKG in PACU was NSR, Occasional PACs, instructed patient not to take any of her cold medications until 8 pm, and not to smoke marijuana today.  No notable events documented.   Last Vitals:  Vitals:   04/30/22 0924 04/30/22 0932  BP: (!) 107/53 109/74  Pulse: 78 84  Resp: (!) 24 18  Temp: (!) 36.4 C   SpO2: 96% 98%    Last Pain:  Vitals:   04/30/22 0932  TempSrc:   PainSc: 0-No pain                 Aemilia Dedrick C Lavonia Eager

## 2022-04-30 NOTE — Op Note (Addendum)
[facility] Patient Name: Novalie Leamy Procedure Date: 04/30/2022 6:59 AM MRN: [id number] Date of Birth: 19-Oct-1990 Attending MD: Norvel Richards , MD, [id number] CSN: [id number] Age: 31 Admit Type:  [place]

## 2022-04-30 NOTE — Op Note (Signed)
[facility] Patient Name: Sabrina Perez Procedure Date: 04/30/2022 8:53 AM MRN: [id number] Date of Birth: [birth year] Attending MD: Norvel Richards , MD, [id number] CSN: [id number] Age: 31 Admit Type: Outpatient Procedure:                Upper GI endoscopy Indications:              Dyspepsia; dark stools Providers:                Norvel Richards, MD, Caprice Kluver, Raphael Gibney, Technician Referring MD:              Medicines:                Propofol per Anesthesia Complications:            No immediate complications. Estimated Blood Loss:     Estimated blood loss was minimal. Procedure:                Pre-Anesthesia Assessment:                           - Prior to the procedure, a History and Physical                            was performed, and patient medications and                            allergies were reviewed. The patient's tolerance of                            previous anesthesia was also reviewed. The risks                            and benefits of the procedure and the sedation                            options and risks were discussed with the patient.                            All questions were answered, and informed consent                            was obtained. Prior Anticoagulants: The patient has                            taken no anticoagulant or antiplatelet agents. ASA                            Grade Assessment: III - A patient with severe                            systemic disease. After reviewing the risks and  benefits, the patient was deemed in satisfactory                            condition to undergo the procedure.                           After obtaining informed consent, the endoscope was                            passed under direct vision. Throughout the                            procedure, the patient's blood pressure, pulse, and                            oxygen  saturations were monitored continuously. The                            [id number]-H190 ([id number]) scope was introduced through the                            mouth, and advanced to the second part of duodenum.                            The upper GI endoscopy was accomplished without                            difficulty. The patient tolerated the procedure                            well. Scope In: 9:11:40 AM Scope Out: 9:19:27 AM Total Procedure Duration: 0 hours 7 minutes 47 seconds  Findings:      The examined esophagus was normal (undulating Z-line). Stomach empty.       Focal antral erosions. No ulcer or infiltrating process seen. Pylorus       patent.      The duodenal bulb and second portion of the duodenum were normal.       Biopsies the antral mucosa taken for histologic study. Impression:               - Normal esophagus. Antral erosions?"status post                            biopsy                           - Normal duodenal bulb and second portion of the                            duodenum.                           - Moderate Sedation:      Moderate (conscious) sedation was personally administered by an       anesthesia professional. The following parameters were monitored: oxygen       saturation, heart  rate, blood pressure, respiratory rate, EKG, adequacy       of pulmonary ventilation, and response to care. Recommendation:           - Patient has a contact number available for                            emergencies. The signs and symptoms of potential                            delayed complications were discussed with the                            patient. Return to normal activities tomorrow.                            Written discharge instructions were provided to the                            patient.                           - Advance diet as tolerated.                           - Continue present medications. Follow-up on                            pathology.                            - Return to my office in 3 months. Procedure Code(s):        --- Professional ---                           [phone], Esophagogastroduodenoscopy, flexible,                            transoral; diagnostic, including collection of                            specimen(s) by brushing or washing, when performed                            (separate procedure) Diagnosis Code(s):        --- Professional ---                           R10.13, Epigastric pain CPT copyright 2022 American Medical Association. All rights reserved. The codes documented in this report are preliminary and upon coder review may  be revised to meet current compliance requirements. Cristopher Estimable. Baden Betsch, MD Norvel Richards, MD 04/30/2022 9:41:07 AM This report has been signed electronically. Number of Addenda: 0

## 2022-04-30 NOTE — Progress Notes (Signed)
Dr. Charna Elizabeth in to evaluate patient, EKG report viewed by Dr. Charna Elizabeth, [facility] per Dr. Charna Elizabeth to discharge home.

## 2022-05-01 LAB — SURGICAL PATHOLOGY

## 2022-05-07 ENCOUNTER — Encounter (HOSPITAL_COMMUNITY): Payer: Self-pay | Admitting: Internal Medicine

## 2022-05-08 ENCOUNTER — Other Ambulatory Visit: Payer: Self-pay

## 2022-05-08 ENCOUNTER — Telehealth: Payer: Self-pay

## 2022-05-08 MED ORDER — BISMUTH/METRONIDAZ/TETRACYCLIN 140-125-125 MG PO CAPS: 3.0000 | ORAL_CAPSULE | Freq: Four times a day (QID) | ORAL | 0 refills | Status: DC

## 2022-05-08 NOTE — Telephone Encounter (Signed)
Clarification was made regarding PPI, Dr. Gala Romney stated patient is ok to continue omeprazole 40 mg twice daily instead of changing to Protonix 40 mg twice daily. Pt was made aware, Rx was sent in to pharmacy on file.   Perez, Sabrina Estimable, MD  Lodema Hong, CMA Cc: Marcia Brash, RN Biopsies show H. pylori infection.  Needs treatment; Pylera 3 capsules 4 times a day x 14 days.  Twice daily Protonix 40 mg x 14 days.-All no refills.  Please let her know.  Need follow-up appointment with app in 3 months to reassess and plan to verify eradication.

## 2022-05-11 ENCOUNTER — Other Ambulatory Visit: Payer: Self-pay

## 2022-05-11 MED ORDER — BISMUTH/METRONIDAZ/TETRACYCLIN 140-125-125 MG PO CAPS: 3.0000 | ORAL_CAPSULE | Freq: Four times a day (QID) | ORAL | 0 refills | Status: AC

## 2022-07-13 ENCOUNTER — Telehealth: Payer: Self-pay | Admitting: Internal Medicine

## 2022-07-13 NOTE — Telephone Encounter (Signed)
Spoke to pt, she informed me that she is pregnant about 6 weeks. She has not had a BM in a week. She stop taking Linzess about 2 weeks ago, because she was not sure if she should be taking it. Also not taking pantoprazole. Please advise.

## 2022-07-13 NOTE — Telephone Encounter (Signed)
Spoke to pt, informed her of recommendations. Pt voiced understanding.

## 2022-07-13 NOTE — Telephone Encounter (Signed)
Pt has questions for the nurse regarding constipation, being pregnant and taking medication. She had sent a MyChart message. Please call [phone] or respond via [place].

## 2022-07-14 NOTE — Progress Notes (Unsigned)
Primary Care Physician:  Raiford Simmonds., PA-C  Primary Gastroenterologist: Cristopher Estimable. Rourk, MD  Patient Location: Home Reason for Visit: follow up  Persons present on the virtual encounter, with roles: Patient - Sabrina Perez; Provider - Venetia Night, NP   Total time (minutes) spent on medical discussion: 15 minutes  Virtual Visit Encounter Note Visit is conducted virtually and was requested by patient.   I connected with [name] on 07/15/22 at  8:00 AM EST by video and verified that I am speaking with the correct person using two identifiers.   I discussed the limitations, risks, security and privacy concerns of performing an evaluation and management service by video and the availability of in person appointments. I also discussed with the patient that there may be a patient responsible charge related to this service. The patient expressed understanding and agreed to proceed.  Chief Complaint  Patient presents with   Follow-up    History of Present Illness: Sabrina Perez is a 32 y.o. female with a history of asthma, chronic GERD, advanced colon polyps via colonoscopy in 2018, and chronic constipation presenting virtually for follow up with complaint of ongoing constipation and reflux now that she is pregnant.   Last colonoscopy in August 2021: -normal without any polyps -Due for surveillance in 2026 given history of advanced polyp.  Last visit virtually 04/01/22. GERD controlled on PPI. May vomit if eats too late in the evenings despite medication. Denied overt dysphagia. Had not taken any medication for 2 weeks. Pain like razor blade with defecation. Reported severe diarrhea with Linzess 145 mcg and had severe diarrhea. Denied abdominal pain, only mild cramping when need to have a BM. Reported black stools for last month or so. Occasional brbpr with most BMs. Denied lightheadedness or dizziness. Scheduled for EGD. Continue omeprazole 40 mg BID and Linzess  72 mcg daily. Use pepcid and tums as needed. Advised colace once daily and then increase to twice daily. Follow high fiber diet. Advised f/u 3 months after procedure.   EGD 04/30/22: -normal esophagus -normal duodenum -antral erosions s/p biopsy -biopsy with active chronic gastritis, + H. Pylori -Treated with pylera and continue omeprazole 40 mg BID.   Today: Patient recently found out she is pregnant about 6 weeks.  Still hasn't taken anything to help with constipation. Last BM was about 2 weeks ago. Having cramping to her abdomen.  Constant burning even with water. Things has gotten better after her H. Pylori treated. Last dose of omeprazole was around 2/7 when she found out she was pregnant. No melena or brbpr. Has been having some morning nausea and some vomiting. Thinks she may be having a hard time staying hydrated.   OBGYN appointment in 2 weeks.    Medications No outpatient medications have been marked as taking for the 07/15/22 encounter (Video Visit) with Sherron Monday, NP.     History Past Medical History:  Diagnosis Date   Asthma    Constipation    Environmental and seasonal allergies    GERD (gastroesophageal reflux disease)    Nausea with vomiting 02/22/2019    Past Surgical History:  Procedure Laterality Date   BIOPSY  04/30/2022   Procedure: BIOPSY;  Surgeon: Daneil Dolin, MD;  [facility];  Service: Endoscopy;;   COLONOSCOPY WITH PROPOFOL N/A 10/29/2016   Procedure: COLONOSCOPY WITH PROPOFOL;  Surgeon: Daneil Dolin, MD;  [facility]: [facility];  Service: Endoscopy;  Laterality: N/A;  1230  COLONOSCOPY WITH PROPOFOL N/A 01/22/2020   Procedure: COLONOSCOPY WITH PROPOFOL;  Surgeon: Daneil Dolin, MD;  [facility]: [facility];  Service: Endoscopy;  Laterality: N/A;  2:15pm   ESOPHAGOGASTRODUODENOSCOPY (EGD) WITH PROPOFOL N/A 04/30/2022   Procedure: ESOPHAGOGASTRODUODENOSCOPY (EGD) WITH PROPOFOL;  Surgeon: Daneil Dolin, MD;  Location: [facility];  Service: Endoscopy;  Laterality: N/A;  7:30am, asa 2   POLYPECTOMY  10/29/2016   Procedure: POLYPECTOMY;  Surgeon: Daneil Dolin, MD;  Location: [facility];  Service: Endoscopy;;  colon   WISDOM TOOTH EXTRACTION      Family History  Problem Relation Age of Onset   Anxiety disorder Mother    Drug abuse Father    Alcohol abuse Maternal Uncle    Alcohol abuse Paternal Uncle    Drug abuse Paternal Uncle    Anxiety disorder Paternal Aunt    Depression Paternal Aunt    Colon polyps Paternal Aunt    Colon cancer Neg Hx     Social History   Socioeconomic History   Marital status: Single    Spouse name: Not on file   Number of children: Not on file   Years of education: Not on file   Highest education level: Not on file  Occupational History   Not on file  Tobacco Use   Smoking status: Every Day    Packs/day: 1.00    Years: 4.00    Total pack years: 4.00    Types: Cigarettes   Smokeless tobacco: Never  Vaping Use   Vaping Use: Every day   Start date: 09/16/2014  Substance and Sexual Activity   Alcohol use: Yes    Alcohol/week: 12.0 standard drinks of alcohol    Types: 12 Cans of beer per week    Comment: 1 pint of liquor per week   Drug use: Yes    Frequency: 7.0 times per week    Types: Marijuana    Comment: last use 04/30/2022   Sexual activity: Not Currently    Birth control/protection: Injection  Other Topics Concern   Not on file  Social History Narrative   Not on file   Social Determinants of Health   Financial Resource Strain: Not on file  Food Insecurity: Not on file  Transportation Needs: Not on file  Physical Activity: Not on file  Stress: Not on file  Social Connections: Not on file      Review of Systems: Gen: Denies fever, chills, anorexia. Denies fatigue, weakness, weight loss.  CV: Denies chest pain, palpitations, syncope, peripheral edema, and claudication. Resp: Denies dyspnea at rest, cough, wheezing, coughing up blood, and  pleurisy. GI: see HPI Derm: Denies rash, itching, dry skin Psych: Denies depression, anxiety, memory loss, confusion. No homicidal or suicidal ideation.  Heme: Denies bruising, bleeding, and enlarged lymph nodes.  Observations/Objective: No distress. Alert and oriented. Pleasant. Well nourished. Normal mood and affect. Unable to perform complete physical exam due to video encounter.   Assessment:  GERD, H. Pylori gastritis: History of GERD on PPI BID. EGD in December 2023 with h. Pylori gastritis and treated with PPI BID and prevpac. Reported symptoms were well controlled until recently finding out she was pregnant and stopped all medication due to this. Advised her to use tums (no more than 1000 mg daily) as needed and to confirm other options with OBGYN given mixed evidence on use of H2 blocker and PPI in pregnancy. Does have nausea and vomiting related to pregnancy and trying to stay hydrated.  To assess for h. Pylori eradication breath test has been ordered.   Constipation: Previously on stool softener and linzess with fair results. Last BM 2 weeks prior at which time she stopped all medications given pregnancy. Does have some mild abdominal cramping likely related to constipation which may also be exacerbated by dehydration given N/V. Encouraged hydration and if needed to received IV fluids. Advised colace and miralax as safe options in pregnancy and encouraged to begin today as well as glycerin suppository. Other safe option is tap water enema as needed for immediate relief.   Plan:  Will need to confirm safe options for GERD and Constipation with OBGYN May use colace 100 mg BID and Miralax 17g BID Glycerin suppository Okay to use Tums as needed. No more than 1000 mg of calcium daily and need to confirm safe options with OBGYN. H. Pylori breath test GERD handout Seek IV hydration in ED if ongoing signs of dehydration.  Work note provided    Follow Up Instructions:  Follow up in 6  months, sooner if needed.   I discussed the assessment and treatment plan with the patient. The patient was provided an opportunity to ask questions and all were answered. The patient agreed with the plan and demonstrated an understanding of the instructions.   The patient was advised to call back or seek an in-person evaluation if the symptoms worsen or if the condition fails to improve as anticipated.    Venetia Night, MSN, APRN, FNP-BC, AGACNP-BC [facility]

## 2022-07-15 ENCOUNTER — Encounter: Payer: Self-pay | Admitting: Gastroenterology

## 2022-07-15 ENCOUNTER — Telehealth (INDEPENDENT_AMBULATORY_CARE_PROVIDER_SITE_OTHER): Payer: Medicaid Other | Admitting: Gastroenterology

## 2022-07-15 VITALS — Ht 62.0 in | Wt 260.0 lb

## 2022-07-15 DIAGNOSIS — K5900 Constipation, unspecified: Secondary | ICD-10-CM | POA: Diagnosis not present

## 2022-07-15 DIAGNOSIS — A048 Other specified bacterial intestinal infections: Secondary | ICD-10-CM | POA: Diagnosis not present

## 2022-07-15 DIAGNOSIS — K219 Gastro-esophageal reflux disease without esophagitis: Secondary | ICD-10-CM | POA: Diagnosis not present

## 2022-07-15 NOTE — Patient Instructions (Addendum)
May use colace 100 mg twice daily and may take up to 3 times a day if needed.  Okay to begin Miralax 17g twice daily (mixed in 8 oz fluid of your choice).   Glycerin suppository as needed, may go ahead and do one today. Any other options to help with constipation should be confirmed with OBGYN. A tap water enema is also safe for you for immediate relief now.   Okay to use Tums as needed. No more than 1000 mg of calcium daily and need to confirm other safe options with OBGYN.  H. Pylori breath test is ordered, you may go to [facility] to have this performed. Make sure you are NPO for at least 8 hours. Only water to drink if needed.   Follow a GERD diet:  Avoid fried, fatty, greasy, spicy, citrus foods. Avoid caffeine and carbonated beverages. Avoid chocolate. Try eating 4-6 small meals a day rather than 3 large meals. Do not eat within 3 hours of laying down. Prop head of bed up on wood or bricks to create a 6 inch incline.  I have attached a separate handout regarding things to avoid to help with your reflux.   It was a pleasure to see you today. I want to create trusting relationships with patients. If you receive a survey regarding your visit,  I greatly appreciate you taking time to fill this out on paper or through your MyChart. I value your feedback.  Venetia Night, MSN, FNP-BC, AGACNP-BC [facility]

## 2022-07-15 NOTE — Telephone Encounter (Signed)
Noted  Pt had vv this am

## 2022-07-17 ENCOUNTER — Telehealth: Payer: Self-pay | Admitting: *Deleted

## 2022-07-17 ENCOUNTER — Other Ambulatory Visit: Payer: Self-pay | Admitting: *Deleted

## 2022-07-17 NOTE — Addendum Note (Signed)
Addended by: Inda Castle on: 07/17/2022 11:08 AM   Modules accepted: Orders

## 2022-07-17 NOTE — Telephone Encounter (Signed)
Pt called and states she went to the ER yesterday and was giving an antibiotic. She states she knows she needs to wait 14 days after the antibiotic before she could do the breath test for H. Pylori. She states she would like to do the stool test instead. I changed the order.

## 2022-07-17 NOTE — Addendum Note (Signed)
Addended by: Inda Castle on: 07/17/2022 11:11 AM   Modules accepted: Orders

## 2022-07-20 ENCOUNTER — Encounter: Payer: Self-pay | Admitting: *Deleted

## 2022-07-20 NOTE — Telephone Encounter (Signed)
Pt left MyChart message

## 2022-07-20 NOTE — Telephone Encounter (Signed)
LMOM for pt to call office. Sent MyChart message.

## 2022-07-30 ENCOUNTER — Telehealth: Payer: Medicaid Other | Admitting: Gastroenterology

## 2022-12-22 ENCOUNTER — Encounter: Payer: Self-pay | Admitting: Gastroenterology
